# Patient Record
Sex: Female | Born: 1950 | Race: White | Hispanic: No | Smoking: Current every day smoker
Health system: Southern US, Community
[De-identification: ages and names within clinical notes are randomized; demographics above are authoritative.]

## PROBLEM LIST (undated history)

## (undated) DIAGNOSIS — Z720 Tobacco use: Secondary | ICD-10-CM

## (undated) DIAGNOSIS — J449 Chronic obstructive pulmonary disease, unspecified: Secondary | ICD-10-CM

## (undated) DIAGNOSIS — E785 Hyperlipidemia, unspecified: Secondary | ICD-10-CM

## (undated) DIAGNOSIS — I10 Essential (primary) hypertension: Secondary | ICD-10-CM

## (undated) HISTORY — PX: CHOLECYSTECTOMY: SHX55

## (undated) HISTORY — DX: Hyperlipidemia, unspecified: E78.5

## (undated) HISTORY — DX: Tobacco use: Z72.0

## (undated) HISTORY — PX: RECTAL SURGERY: SHX760

## (undated) HISTORY — PX: TONSILLECTOMY: SUR1361

---

## 2009-08-12 ENCOUNTER — Ambulatory Visit: Payer: Self-pay | Admitting: Gastroenterology

## 2009-08-12 ENCOUNTER — Inpatient Hospital Stay (HOSPITAL_COMMUNITY): Admission: AD | Admit: 2009-08-12 | Discharge: 2009-08-15 | Payer: Self-pay | Admitting: Gastroenterology

## 2009-09-09 ENCOUNTER — Ambulatory Visit (HOSPITAL_COMMUNITY): Admission: RE | Admit: 2009-09-09 | Discharge: 2009-09-09 | Payer: Self-pay | Admitting: Gastroenterology

## 2009-10-26 ENCOUNTER — Ambulatory Visit (HOSPITAL_COMMUNITY): Admission: RE | Admit: 2009-10-26 | Discharge: 2009-10-26 | Payer: Self-pay | Admitting: General Surgery

## 2009-10-26 ENCOUNTER — Encounter (INDEPENDENT_AMBULATORY_CARE_PROVIDER_SITE_OTHER): Payer: Self-pay | Admitting: General Surgery

## 2011-01-15 ENCOUNTER — Encounter: Payer: Self-pay | Admitting: Emergency Medicine

## 2011-03-28 LAB — PROTIME-INR
INR: 0.88 (ref 0.00–1.49)
Prothrombin Time: 11.9 seconds (ref 11.6–15.2)

## 2011-03-28 LAB — CBC
Hemoglobin: 14.2 g/dL (ref 12.0–15.0)
MCHC: 33.5 g/dL (ref 30.0–36.0)
MCV: 91.8 fL (ref 78.0–100.0)
RBC: 4.61 MIL/uL (ref 3.87–5.11)
WBC: 12.3 10*3/uL — ABNORMAL HIGH (ref 4.0–10.5)

## 2011-03-28 LAB — BASIC METABOLIC PANEL
CO2: 27 mEq/L (ref 19–32)
Chloride: 107 mEq/L (ref 96–112)
GFR calc Af Amer: >60 mL/min (ref >60)
Sodium: 140 mEq/L (ref 135–145)

## 2011-03-31 LAB — CBC
HCT: 28.9 % — ABNORMAL LOW (ref 36.0–46.0)
HCT: 35.5 % — ABNORMAL LOW (ref 36.0–46.0)
HCT: 42.1 % (ref 36.0–46.0)
Hemoglobin: 12 g/dL (ref 12.0–15.0)
Hemoglobin: 9.9 g/dL — ABNORMAL LOW (ref 12.0–15.0)
MCHC: 33.8 g/dL (ref 30.0–36.0)
MCHC: 33.9 g/dL (ref 30.0–36.0)
MCHC: 34.1 g/dL (ref 30.0–36.0)
MCV: 92.6 fL (ref 78.0–100.0)
MCV: 92.6 fL (ref 78.0–100.0)
Platelets: 239 10*3/uL (ref 150–400)
Platelets: 295 10*3/uL (ref 150–400)
RBC: 3.14 MIL/uL — ABNORMAL LOW (ref 3.87–5.11)
RBC: 3.54 MIL/uL — ABNORMAL LOW (ref 3.87–5.11)
RBC: 3.6 MIL/uL — ABNORMAL LOW (ref 3.87–5.11)
RBC: 3.83 MIL/uL — ABNORMAL LOW (ref 3.87–5.11)
RDW: 13.8 % (ref 11.5–15.5)
RDW: 13.9 % (ref 11.5–15.5)
RDW: 13.9 % (ref 11.5–15.5)
RDW: 14 % (ref 11.5–15.5)
WBC: 10 10*3/uL (ref 4.0–10.5)
WBC: 16.8 10*3/uL — ABNORMAL HIGH (ref 4.0–10.5)
WBC: 9.1 10*3/uL (ref 4.0–10.5)

## 2011-03-31 LAB — CROSSMATCH
ABO/RH(D): O POS
Antibody Screen: NEGATIVE

## 2011-03-31 LAB — COMPREHENSIVE METABOLIC PANEL
ALT: 106 U/L — ABNORMAL HIGH (ref 0–35)
ALT: 92 U/L — ABNORMAL HIGH (ref 0–35)
AST: 97 U/L — ABNORMAL HIGH (ref 0–37)
Albumin: 3.5 g/dL (ref 3.5–5.2)
Alkaline Phosphatase: 305 U/L — ABNORMAL HIGH (ref 39–117)
Alkaline Phosphatase: 374 U/L — ABNORMAL HIGH (ref 39–117)
CO2: 26 mEq/L (ref 19–32)
Chloride: 103 mEq/L (ref 96–112)
Chloride: 108 mEq/L (ref 96–112)
GFR calc Af Amer: >60 mL/min (ref >60)
GFR calc non Af Amer: >60 mL/min (ref >60)
Glucose, Bld: 107 mg/dL — ABNORMAL HIGH (ref 70–99)
Potassium: 3.5 mEq/L (ref 3.5–5.1)
Potassium: 3.7 mEq/L (ref 3.5–5.1)
Sodium: 136 mEq/L (ref 135–145)
Sodium: 140 mEq/L (ref 135–145)
Total Bilirubin: 1.3 mg/dL — ABNORMAL HIGH (ref 0.3–1.2)
Total Protein: 6.7 g/dL (ref 6.0–8.3)

## 2011-03-31 LAB — HEMOGLOBIN AND HEMATOCRIT, BLOOD
HCT: 29.3 % — ABNORMAL LOW (ref 36.0–46.0)
Hemoglobin: 9.9 g/dL — ABNORMAL LOW (ref 12.0–15.0)

## 2011-03-31 LAB — BASIC METABOLIC PANEL
BUN: 8 mg/dL (ref 6–23)
CO2: 25 mEq/L (ref 19–32)
Calcium: 8.2 mg/dL — ABNORMAL LOW (ref 8.4–10.5)
Creatinine, Ser: 0.59 mg/dL (ref 0.4–1.2)
Creatinine, Ser: 0.61 mg/dL (ref 0.4–1.2)
GFR calc Af Amer: >60 mL/min (ref >60)
GFR calc Af Amer: >60 mL/min (ref >60)
GFR calc non Af Amer: >60 mL/min (ref >60)
GFR calc non Af Amer: >60 mL/min (ref >60)
Glucose, Bld: 94 mg/dL (ref 70–99)

## 2011-03-31 LAB — PROTIME-INR: INR: 0.9 (ref 0.00–1.49)

## 2011-03-31 LAB — ABO/RH: ABO/RH(D): O POS

## 2011-05-08 NOTE — Discharge Summary (Signed)
[name], [name] Mccullough               ACCOUNT NO.:  [id number]   MEDICAL RECORD NO.:  [id number]          PATIENT TYPE:  INP   LOCATION:  1508                         FACILITY:  [facility]   PHYSICIAN:  Jordan Hawks. Elnoria Howard, MD    DATE OF BIRTH:  1951-11-13   DATE OF ADMISSION:  08/12/2009  DATE OF DISCHARGE:                               DISCHARGE SUMMARY   DISCHARGE DIAGNOSES:  1. Post sphincterotomy, post endoscopic retrograde      cholangiopancreatography sphincterotomy bleed.  2. Status post embolization of the gastroduodenal artery.  3. Cholelithiasis.  4. Choledocholithiasis, status post stent placement.  5. Pyloric channel ulcer with stenosis.   HISTORY AND PHYSICAL:  Please see the original H and P for full details,  as well as the procedure note.   HOSPITAL COURSE:  The patient after the endoscopic procedures, it was  felt that her bleeding was stable.  All the residual blood was  aspirated, however, approximately 1 hour after the termination of the  endoscopic procedures, nursing reported that she had vomited 100 mL of  fresh blood.  At that point, it was felt that she was having persistent  bleeding from the sphincterotomy site.  Subsequently, an interventional  radiology consultation was obtained from Dr. Gwenith Daily.  With his  interventional procedure, the gastroduodenal artery was embolized.  There was no significant bleed that was identified during radiologic  procedure, however, it was felt that it would be in the patient's best  interest to proceed with embolization.  Since that time, the patient has  done well.  No complaints of any pain in her abdomen from the  embolization and there was no evidence of any further bleeding.  Her  hemoglobin did drop down from 14.3 down to approximately 9.8, and has  remained stable in the upper 9 range.  On the day of discharge, the  patient felt well and she was advanced to a full diet 1-day before her  discharge.  She did report having some  mild intermittent epigastric pain  with p.o. intake, which was similar to her prior gallstone pain.  In  light of that with a normal white blood cell counts and overall stable  clinical picture, it was felt that she could be discharged with close  monitoring on an outpatient basis.  Her alkaline phosphatase has  decreased to 305, AST is 72 from 97 and ALT did increase to 106 from 92.  However, the total bilirubin dropped down from 1.3-0.9.  There is the  possibility that she may have some clots around this sphincterotomy site  that could partially block the drainage of bile.  However, this clot  could resolve and allow for further drainage at this time.  She does  have a 7-French 5-cm stent in place to help temporize the situation.  Plan at this time is for the patient follow up with Dr. Elnoria Howard in 2 weeks.  She was started on sucralfate in addition to her Prilosec OTC.  Additionally, Dr. Elnoria Howard will contact the patient in 2 days to assess and  ensure that she is still clinically stable.  If her pain that is  markedly increased or becomes persistent and/or there is any evidence of  fever, an ERCP will be pursued this week for stone extraction.      Jordan Hawks Elnoria Howard, MD  Electronically Signed     PDH/MEDQ  D:  08/15/2009  T:  08/15/2009  Job:  [id number]

## 2011-05-08 NOTE — H&P (Signed)
[name], [name]               ACCOUNT NO.:  [id number]   MEDICAL RECORD NO.:  [id number]          PATIENT TYPE:  INP   LOCATION:  1224                         FACILITY:  [facility]   PHYSICIAN:  Jordan Hawks. Elnoria Howard, MD    DATE OF BIRTH:  September 23, 1951   DATE OF ADMISSION:  08/12/2009  DATE OF DISCHARGE:                              HISTORY & PHYSICAL   PRIMARY CARE Brandice Busser:  [facility].   HISTORY OF PRESENT ILLNESS:  [name] Mccullough is a patient that was initially  referred for complaints of fever and abdominal pain.  She initially  underwent examination at [facility] and laboratory values  revealed that she had an obstructive pattern in regards to her liver  panel.  CT scan was performed and there was evidence of a filling defect  in the CBD, but could not be determined the exact etiology.  Subsequently she was referred to Dr. Elnoria Howard for further evaluation and  treatment.  She was taken into endoscopy for further workup of these  abnormalities.  A repeat liver panel several days before the endoscopic  procedures revealed that she did have a drop in her transaminases.  It  is suggesting that the obstruction had temporarily relieved itself.  On  the day of endoscopy, the patient initially underwent an endoscopic  ultrasound.  The surveillance of the abdominal structures through the  gastric lumen was completed, however, the echo endoscope was not able to  be advanced to the pylorus because of a pyloric stenosis and an adjacent  pyloric channel ulcer.  The ulcer required balloon dilation and repeat  attempts to pass the echo endoscope failed.  The area was then dilated  again and then at that point, it was felt that an ERCP should be  pursued.  During the ERCP procedure, cannulation of the bile duct was  performed with mild-moderate difficulty.  Contrast injection did reveal  a stone in the proximal CBD measuring approximately 8 mm in size.  The  patient was found to  have a very large and generous papilla.  A large  sphincterotomy was created.  However, an arterial was cut during the  creation of a sphincterotomy.  This resulted in significant amount of  bleeding.  Because of the bleeding, the stone was not able to be  extracted.  Attention had to be focused on hemostasis.  Subsequently it  was felt that a biliary stent would help to temporize any further issues  in regards to her common bile duct stone.  Multiple injections of  epinephrine and also repeat procedures with duodenoscope and also the  upper endoscope initially arrest of the bleeding.  Also with the help of  Dr. Arlyce Dice, a total of what was felt to be four successfully placed  hemoclips.  During the time of the procedures, her vital signs remained  stable.  In fact, she was hypertensive.  She was hypertensive from the  initiation of the procedures.  At that point with monitoring the site,  there was no evidence of any further bleeding.  The procedure was  terminated.  Subsequently in the recovery area approximately 1 hour  after the procedure, Dr. Elnoria Howard was notified that she had vomited up 100  mL of fresh blood.  Because of this type of finding, it was felt that  her bleeding had recurred and subsequently she was referred to  Interventional Radiology where Dr. Bonnielee Haff was to attempt to arrest the  bleeding.   PAST MEDICAL/SURGICAL HISTORY:  Significant for hypertension.   MEDICATIONS:  Lisinopril.   ALLERGIES:  NO KNOWN DRUG ALLERGIES.   REVIEW OF SYSTEMS:  As stated above in the history present illness,  otherwise negative.   FAMILY HISTORY:  Noncontributory.   SOCIAL HISTORY:  Negative for alcohol, tobacco, illicit drug use.   PHYSICAL EXAMINATION:  VITAL SIGNS:  Blood pressure is in the 170s/110s,  heart rate ranges from 88-102, pulse ox is 100% on 2 liters nasal  cannula.  GENERAL:  The patient is sedated from the procedure.  HEENT:  Normocephalic, atraumatic.  Extraocular  muscles appear to be  intact.  NECK:  Supple.  No lymphadenopathy.  LUNGS:  Clear to auscultation bilaterally.  CARDIOVASCULAR:  Regular rate and rhythm.  ABDOMEN:  Flat, soft, nontender, nondistended.  Positive bowel sounds.  EXTREMITIES:  No clubbing, cyanosis or edema.   LABORATORY VALUES:  STAT hemoglobin revealed it was at 14.3.   IMPRESSION:  1. Post-sphincterotomy bleeding.  2. Common bile duct stone.  3. Pyloric channel ulcer.  4. Pyloric stenosis.   In light of no further bleeding, it is felt that Dr. Bonnielee Haff stopped her  bleeding during his radiologic procedure.  If this fails, then a  surgical consultation will be warranted.  However, it is possible to  reattempt hemostasis endoscopically if that is required.  Her hemoglobin  will be checked on a q.4 h. basis for the first 12 hours.  She will be  typed and screened for further management pending her clinical course.      Jordan Hawks Elnoria Howard, MD  Electronically Signed     PDH/MEDQ  D:  08/13/2009  T:  08/13/2009  Job:  [id number]   cc:   [facility]

## 2018-11-07 ENCOUNTER — Encounter (HOSPITAL_COMMUNITY): Payer: Self-pay | Admitting: *Deleted

## 2018-11-07 ENCOUNTER — Other Ambulatory Visit: Payer: Self-pay

## 2018-11-07 ENCOUNTER — Emergency Department (HOSPITAL_COMMUNITY): Payer: Medicare Other

## 2018-11-07 ENCOUNTER — Emergency Department (HOSPITAL_COMMUNITY)
Admission: EM | Admit: 2018-11-07 | Discharge: 2018-11-07 | Disposition: A | Discharge status: Home / Self Care | Payer: Medicare Other | Attending: Emergency Medicine | Admitting: Emergency Medicine

## 2018-11-07 DIAGNOSIS — F17200 Nicotine dependence, unspecified, uncomplicated: Secondary | ICD-10-CM | POA: Insufficient documentation

## 2018-11-07 DIAGNOSIS — R0602 Shortness of breath: Secondary | ICD-10-CM | POA: Diagnosis present

## 2018-11-07 DIAGNOSIS — I10 Essential (primary) hypertension: Secondary | ICD-10-CM | POA: Insufficient documentation

## 2018-11-07 DIAGNOSIS — J441 Chronic obstructive pulmonary disease with (acute) exacerbation: Secondary | ICD-10-CM | POA: Diagnosis not present

## 2018-11-07 DIAGNOSIS — Z79899 Other long term (current) drug therapy: Secondary | ICD-10-CM | POA: Diagnosis not present

## 2018-11-07 HISTORY — DX: Essential (primary) hypertension: I10

## 2018-11-07 LAB — BRAIN NATRIURETIC PEPTIDE: B NATRIURETIC PEPTIDE 5: 57.6 pg/mL (ref 0.0–100.0)

## 2018-11-07 LAB — BASIC METABOLIC PANEL
Anion gap: 11 (ref 5–15)
BUN: 13 mg/dL (ref 8–23)
CO2: 25 mmol/L (ref 22–32)
CREATININE: 0.8 mg/dL (ref 0.44–1.00)
Calcium: 9.6 mg/dL (ref 8.9–10.3)
Chloride: 106 mmol/L (ref 98–111)
GFR calc non Af Amer: >60 mL/min (ref >60)
Glucose, Bld: 106 mg/dL — ABNORMAL HIGH (ref 70–99)
POTASSIUM: 3.8 mmol/L (ref 3.5–5.1)
SODIUM: 142 mmol/L (ref 135–145)

## 2018-11-07 LAB — CBC WITH DIFFERENTIAL/PLATELET
ABS IMMATURE GRANULOCYTES: 0.03 10*3/uL (ref 0.00–0.07)
BASOS ABS: 0 10*3/uL (ref 0.0–0.1)
BASOS PCT: 0 %
Eosinophils Absolute: 0.1 10*3/uL (ref 0.0–0.5)
Eosinophils Relative: 1 %
HCT: 48.2 % — ABNORMAL HIGH (ref 36.0–46.0)
Hemoglobin: 14.8 g/dL (ref 12.0–15.0)
Immature Granulocytes: 0 %
Lymphocytes Relative: 10 %
Lymphs Abs: 1.2 10*3/uL (ref 0.7–4.0)
MCH: 30.6 pg (ref 26.0–34.0)
MCHC: 30.7 g/dL (ref 30.0–36.0)
MCV: 99.6 fL (ref 80.0–100.0)
MONO ABS: 1 10*3/uL (ref 0.1–1.0)
Monocytes Relative: 8 %
NEUTROS ABS: 9.4 10*3/uL — AB (ref 1.7–7.7)
NEUTROS PCT: 81 %
NRBC: 0 % (ref 0.0–0.2)
Platelets: 233 10*3/uL (ref 150–400)
RBC: 4.84 MIL/uL (ref 3.87–5.11)
RDW: 12.6 % (ref 11.5–15.5)
WBC: 11.7 10*3/uL — AB (ref 4.0–10.5)

## 2018-11-07 LAB — I-STAT TROPONIN, ED: TROPONIN I, POC: 0 ng/mL (ref 0.00–0.08)

## 2018-11-07 MED ORDER — ALBUTEROL SULFATE HFA 108 (90 BASE) MCG/ACT IN AERS
2.0000 | INHALATION_SPRAY | Freq: Once | RESPIRATORY_TRACT | Status: AC
  Administered 2018-11-07: 2 via RESPIRATORY_TRACT
  Filled 2018-11-07: qty 6.7

## 2018-11-07 MED ORDER — IPRATROPIUM BROMIDE 0.02 % IN SOLN
0.5000 mg | Freq: Once | RESPIRATORY_TRACT | Status: AC
  Administered 2018-11-07: 0.5 mg via RESPIRATORY_TRACT
  Filled 2018-11-07: qty 2.5

## 2018-11-07 MED ORDER — PREDNISONE 10 MG PO TABS: 40.0000 mg | ORAL_TABLET | Freq: Every day | ORAL | 0 refills | Status: AC

## 2018-11-07 MED ORDER — ALBUTEROL (5 MG/ML) CONTINUOUS INHALATION SOLN
10.0000 mg/h | INHALATION_SOLUTION | RESPIRATORY_TRACT | Status: DC
  Administered 2018-11-07: 10 mg/h via RESPIRATORY_TRACT
  Filled 2018-11-07: qty 20

## 2018-11-07 MED ORDER — METHYLPREDNISOLONE SODIUM SUCC 125 MG IJ SOLR
125.0000 mg | Freq: Once | INTRAMUSCULAR | Status: AC
  Administered 2018-11-07: 125 mg via INTRAVENOUS
  Filled 2018-11-07: qty 2

## 2018-11-07 NOTE — ED Notes (Signed)
Patient transported to X-ray 

## 2018-11-07 NOTE — ED Triage Notes (Signed)
Pt started having shortness of breath last night with productive cough. Pt noted to have audible wheezing. Denies CP, reports feeling like she can "get a good breath in, but I can't get a good breath out"

## 2018-11-07 NOTE — ED Provider Notes (Addendum)
[facility] EMERGENCY DEPARTMENT Provider Note  CSN: [id number] Arrival date & time: 11/07/18 0546  Chief Complaint(s) Shortness of Breath  HPI [name] Mccullough is a 67 y.o. female   The history is provided by the patient.  Shortness of Breath  This is a new problem. The average episode lasts 2 days. The problem occurs continuously.The problem has been gradually worsening. Associated symptoms include cough, sputum production and wheezing. Pertinent negatives include no fever, no rhinorrhea, no chest pain and no leg swelling. Risk factors include smoking. Associated medical issues include COPD (undiagnosed; chronic smoker 1.5 pk/day for 26yr). Associated medical issues do not include PE, CAD, heart failure, past MI or DVT.    Past Medical History Past Medical History:  Diagnosis Date  . Hypertension    There are no active problems to display for this patient.  Home Medication(s) Prior to Admission medications   Medication Sig Start Date End Date Taking? Authorizing Provider  atorvastatin (LIPITOR) 10 MG tablet Take 10 mg by mouth at bedtime. 10/28/18  Yes [provider]  escitalopram (LEXAPRO) 10 MG tablet Take 10 mg by mouth daily. 10/28/18  Yes [provider]  lisinopril (PRINIVIL,ZESTRIL) 20 MG tablet Take 20 mg by mouth daily. 10/27/18  Yes [provider]                                                                                                                                    Past Surgical History History reviewed. No pertinent surgical history. Family History No family history on file.  Social History Social History   Tobacco Use  . Smoking status: Current Every Day Smoker  . Smokeless tobacco: Never Used  Substance Use Topics  . Alcohol use: Never    Frequency: Never  . Drug use: Never   Allergies Patient has no known allergies.  Review of Systems Review of Systems  Constitutional: Negative for fever.  HENT:  Negative for rhinorrhea.   Respiratory: Positive for cough, sputum production, shortness of breath and wheezing.   Cardiovascular: Negative for chest pain and leg swelling.   All other systems are reviewed and are negative for acute change except as noted in the HPI  Physical Exam Vital Signs  I have reviewed the triage vital signs BP (!) 173/86   Pulse (!) 101   Temp 98 F (36.7 C) (Oral)   Resp 20   SpO2 94%   Physical Exam  Constitutional: She is oriented to person, place, and time. She appears well-developed and well-nourished. No distress.  HENT:  Head: Normocephalic and atraumatic.  Nose: Nose normal.  Eyes: Pupils are equal, round, and reactive to light. Conjunctivae and EOM are normal. Right eye exhibits no discharge. Left eye exhibits no discharge. No scleral icterus.  Neck: Normal range of motion. Neck supple.  Cardiovascular: Normal rate and regular rhythm. Exam reveals no gallop and no friction rub.  No murmur  heard. Pulmonary/Chest: Accessory muscle usage present. No stridor. No respiratory distress. She has no rales.  Poor air movement throughout  Abdominal: Soft. She exhibits no distension. There is no tenderness.  Musculoskeletal: She exhibits no edema or tenderness.  Neurological: She is alert and oriented to person, place, and time.  Skin: Skin is warm and dry. No rash noted. She is not diaphoretic. No erythema.  Psychiatric: She has a normal mood and affect.  Vitals reviewed.   ED Results and Treatments Labs (all labs ordered are listed, but only abnormal results are displayed) Labs Reviewed  CBC WITH DIFFERENTIAL/PLATELET - Abnormal; Notable for the following components:      Result Value   WBC 11.7 (*)    HCT 48.2 (*)    Neutro Abs 9.4 (*)    All other components within normal limits  BASIC METABOLIC PANEL - Abnormal; Notable for the following components:   Glucose, Bld 106 (*)    All other components within normal limits  BRAIN NATRIURETIC PEPTIDE   I-STAT TROPONIN, ED                                                                                                                         EKG  EKG Interpretation  Date/Time:  Friday November 07 2018 05:56:43 EST Ventricular Rate:  95 PR Interval:    QRS Duration: 116 QT Interval:  395 QTC Calculation: 497 R Axis:   72 Text Interpretation:  Sinus rhythm Biatrial enlargement Left ventricular hypertrophy Anterior infarct, old Nonspecific T abnormalities, lateral leads Baseline wander in lead(s) II NO STEMI No old tracing to compare Confirmed by Drema Pryardama,  [phone]([id number]) on 11/07/2018 6:10:10 AM      Radiology Dg Chest 2 View  Result Date: 11/07/2018 CLINICAL DATA:  Acute onset of shortness of breath and productive cough. Wheezing. EXAM: CHEST - 2 VIEW COMPARISON:  None. FINDINGS: The lungs are hyperexpanded, with flattening of the hemidiaphragms, compatible with COPD. There is no evidence of focal opacification, pleural effusion or pneumothorax. The heart is normal in size; the mediastinal contour is within normal limits. No acute osseous abnormalities are seen. IMPRESSION: Findings of COPD.  Lungs otherwise grossly clear. Electronically Signed   By: Roanna RaiderJeffery  Chang M.D.   On: 11/07/2018 06:45   Pertinent labs & imaging results that were available during my care of the patient were reviewed by me and considered in my medical decision making (see chart for details).  Medications Ordered in ED Medications  albuterol (PROVENTIL,VENTOLIN) solution continuous neb (has no administration in time range)  ipratropium (ATROVENT) nebulizer solution 0.5 mg (has no administration in time range)  methylPREDNISolone sodium succinate (SOLU-MEDROL) 125 mg/2 mL injection 125 mg (125 mg Intravenous Given 11/07/18 0652)  Procedures Procedures   Counseled patient for  approximately 5 minutes regarding smoking cessation. Discussed risks of smoking and how they applied and affected their visit here today. Patient not ready to quit at this time, however will follow up with their primary doctor when they are. Provided with cessation resources  CPT code: 40981: intermediate counseling for smoking cessation   (including critical care time)  Medical Decision Making / ED Course I have reviewed the nursing notes for this encounter and the patient's prior records (if available in EHR or on provided paperwork).    Shortness of breath and long-term smoker.  Poor air movement throughout with accessory muscle use.  Satting well on room air.  Likely undiagnosed COPD with exacerbation.  Chest x-ray without evidence of pneumonia, and confirming findings of COPD.  Patient will be given breathing treatments and Solu-Medrol.  Screening labs reassuring with mild leukocytosis, no anemia.  No significant electrolyte derangements or renal insufficiency.  Patient care turned over to Dr Madilyn Hook at 0730. Patient case and results discussed in detail; please see their note for further ED managment.      Final Clinical Impression(s) / ED Diagnoses Final diagnoses:  SOB (shortness of breath)  COPD exacerbation (HCC)      This chart was dictated using voice recognition software.  Despite best efforts to proofread,  errors can occur which can change the documentation meaning.     Nira Conn, MD 11/07/18 [phone]

## 2018-11-07 NOTE — ED Provider Notes (Signed)
Pt care assumed at 0700. Pt w/ hx/o tobacco use here for eval of sob. Recent patient is consistent with COPD exacerbation. No prior history of COPD. On assessment following hour-long nebulizer treatments she is feeling significantly improved. She does have persistent faint and expiratory wheezes. No respiratory distress on examination. Presentation is not consistent with PE, pneumonia, CHF. She is able to ambulate the department without difficulty. She did have a brief drop in her sats with ambulation to 89% but was asymptomatic at the time and felt like she was breathing at her baseline on ambulation. Plan to discharge home with treatment for COPD exacerbation. Counseled on smoking cessation as well as pulmonary PCP follow-up. Return precautions discussed.   Tilden Fossaees, Anthany Thornhill, MD 11/07/18 [phone]

## 2018-11-13 ENCOUNTER — Ambulatory Visit (INDEPENDENT_AMBULATORY_CARE_PROVIDER_SITE_OTHER): Payer: Medicare Other | Admitting: Pulmonary Disease

## 2018-11-13 ENCOUNTER — Encounter: Payer: Self-pay | Admitting: Pulmonary Disease

## 2018-11-13 VITALS — BP 118/72 | HR 69 | Ht 64.0 in | Wt 114.8 lb

## 2018-11-13 DIAGNOSIS — R0989 Other specified symptoms and signs involving the circulatory and respiratory systems: Secondary | ICD-10-CM

## 2018-11-13 DIAGNOSIS — R9389 Abnormal findings on diagnostic imaging of other specified body structures: Secondary | ICD-10-CM

## 2018-11-13 DIAGNOSIS — J986 Disorders of diaphragm: Secondary | ICD-10-CM | POA: Diagnosis not present

## 2018-11-13 DIAGNOSIS — Z720 Tobacco use: Secondary | ICD-10-CM

## 2018-11-13 DIAGNOSIS — R0602 Shortness of breath: Secondary | ICD-10-CM

## 2018-11-13 MED ORDER — UMECLIDINIUM-VILANTEROL 62.5-25 MCG/INH IN AEPB: 1.0000 | INHALATION_SPRAY | Freq: Every day | RESPIRATORY_TRACT | 2 refills | Status: DC

## 2018-11-13 MED ORDER — VITAMIN D (CHOLECALCIFEROL) 50 MCG (2000 UT) PO CAPS: 2000.0000 [IU] | ORAL_CAPSULE | Freq: Every day | ORAL | 0 refills | Status: DC

## 2018-11-13 MED ORDER — UMECLIDINIUM-VILANTEROL 62.5-25 MCG/INH IN AEPB: 1.0000 | INHALATION_SPRAY | Freq: Every day | RESPIRATORY_TRACT | 0 refills | Status: DC

## 2018-11-13 NOTE — Progress Notes (Signed)
Synopsis: Referred in November 2019 for COPD by Tilden Fossaees, Elizabeth, MD  Subjective:   PATIENT ID: [name] JunkerBonita S Tracz GENDER: female DOB: [birth year], MRN: [id number]  Chief Complaint  Patient presents with  . Consult    States she recently went to ER for an exacerbation. States she has a upper gastric ulcer and when aggravated she gets chest pain. Was given proventil rescue inhaler that she has not used.     She was recently seen in the ED. There was concern for COPD exacerbation. Never had PFTS before. She was given albuterol and steroids for a couple days and she finally started to feel better. This was her first time this has every happened. She has never had a COPD exacerbation like this before. She has been smoking since age 67. Now smoking 3-4 cigarettes per day. She feels SOB while walking and DOE while climbing stairs.  She has daily cough with sputum production.  She occasionally has wheezing.  She has noticed significant decline over the past couple of months.  This last/first exacerbation requiring ED admission really made her nervous about her breathing and she does understand that she needs to quit smoking.  She is reluctant to try medications to help quit such as Chantix.  Due to the side effects.  She has nicotine patches at home and will plan to use those over the coming weeks and to help decrease her cigarette smoking.  He does have a plan to quit.  Past Medical History:  Diagnosis Date  . HLD (hyperlipidemia)   . Hypertension   . Tobacco abuse      Family History  Problem Relation Age of Onset  . Heart disease Mother   . Cancer Father        unknown  kind      Past Surgical History:  Procedure Laterality Date  . CHOLECYSTECTOMY      Social History   Socioeconomic History  . Marital status: Divorced    Spouse name: Not on file  . Number of children: Not on file  . Years of education: Not on file  . Highest education level: Not on file  Occupational History  . Not  on file  Social Needs  . Financial resource strain: Not on file  . Food insecurity:    Worry: Not on file    Inability: Not on file  . Transportation needs:    Medical: Not on file    Non-medical: Not on file  Tobacco Use  . Smoking status: Current Every Day Smoker    Types: Cigarettes  . Smokeless tobacco: Never Used  . Tobacco comment: pack per day 11.21.19  Substance and Sexual Activity  . Alcohol use: Yes    Frequency: Never    Comment: occasional, social   . Drug use: Never  . Sexual activity: Not on file  Lifestyle  . Physical activity:    Days per week: Not on file    Minutes per session: Not on file  . Stress: Not on file  Relationships  . Social connections:    Talks on phone: Not on file    Gets together: Not on file    Attends religious service: Not on file    Active member of club or organization: Not on file    Attends meetings of clubs or organizations: Not on file    Relationship status: Not on file  . Intimate partner violence:    Fear of current or ex partner: Not on file  Emotionally abused: Not on file    Physically abused: Not on file    Forced sexual activity: Not on file  Other Topics Concern  . Not on file  Social History Narrative  . Not on file     No Known Allergies   Outpatient Medications Prior to Visit  Medication Sig Dispense Refill  . atorvastatin (LIPITOR) 10 MG tablet Take 10 mg by mouth at bedtime.  11  . escitalopram (LEXAPRO) 10 MG tablet Take 10 mg by mouth daily.  11  . lisinopril (PRINIVIL,ZESTRIL) 20 MG tablet Take 20 mg by mouth daily.  11   No facility-administered medications prior to visit.     Review of Systems  Constitutional: Negative.   HENT: Positive for congestion. Negative for ear discharge, ear pain, hearing loss, nosebleeds, sinus pain, sore throat and tinnitus.   Eyes: Negative.   Respiratory: Positive for cough, sputum production, shortness of breath and wheezing. Negative for hemoptysis and stridor.     Cardiovascular: Positive for chest pain.  Gastrointestinal: Positive for heartburn. Negative for abdominal pain, blood in stool, constipation, diarrhea, melena, nausea and vomiting.  Genitourinary: Negative.   Musculoskeletal: Negative.   Skin: Negative.   Neurological: Negative.   Endo/Heme/Allergies: Negative.   Psychiatric/Behavioral: Negative.      Objective:  Physical Exam  Constitutional: She is oriented to person, place, and time. She appears well-developed. No distress.  HENT:  Head: Normocephalic and atraumatic.  Mouth/Throat: Oropharynx is clear and moist. No oropharyngeal exudate.  Eyes: Pupils are equal, round, and reactive to light. Conjunctivae and EOM are normal.  Neck: No JVD present. No tracheal deviation present.  Loss of supraclavicular fat  Cardiovascular: Normal rate, regular rhythm, S1 normal, S2 normal and intact distal pulses.  Distant heart tones  Pulmonary/Chest: No accessory muscle usage or stridor. No tachypnea. She has decreased breath sounds (throughout all lung fields). She has no wheezes. She has no rhonchi. She has no rales.  Increased AP chest diameter  Abdominal: Soft. Bowel sounds are normal. She exhibits no distension. There is no tenderness.  Musculoskeletal: She exhibits no edema or deformity (muscle wasting ).  Neurological: She is alert and oriented to person, place, and time.  Skin: Skin is warm and dry. Capillary refill takes less than 2 seconds. No rash noted.  Psychiatric: She has a normal mood and affect. Her behavior is normal.  Vitals reviewed.    Vitals:   11/13/18 1024  BP: 118/72  Pulse: 69  SpO2: 99%  Weight: 114 lb 12.8 oz (52.1 kg)  Height: 5\' 4"  (1.626 m)   99% on RA BMI Readings from Last 3 Encounters:  11/13/18 19.71 kg/m   Wt Readings from Last 3 Encounters:  11/13/18 114 lb 12.8 oz (52.1 kg)     CBC    Component Value Date/Time   WBC 11.7 (H) 11/07/2018 0627   RBC 4.84 11/07/2018 0627   HGB 14.8  11/07/2018 0627   HCT 48.2 (H) 11/07/2018 0627   PLT 233 11/07/2018 0627   MCV 99.6 11/07/2018 0627   MCH 30.6 11/07/2018 0627   MCHC 30.7 11/07/2018 0627   RDW 12.6 11/07/2018 0627   LYMPHSABS 1.2 11/07/2018 0627   MONOABS 1.0 11/07/2018 0627   EOSABS 0.1 11/07/2018 0627   BASOSABS 0.0 11/07/2018 0627   Chest Imaging: 11/07/2018 chest x-ray: Flattening of the bilateral diaphragms, slender cardiac silhouette, hyperinflation, significant bone loss in the vertebral bodies The patient's images have been independently reviewed by me.   Pulmonary  Functions Testing Results: No flowsheet data found.  FeNO: None   Pathology: None   Echocardiogram: None   Heart Catheterization: None     Assessment & Plan:   SOB (shortness of breath) - Plan: Pulmonary Function Test, 6 minute walk  Hyperinflation of lungs  Bilateral flattening of diaphragm  Tobacco abuse  Discussion:  This is a 67 year old female with a significant history of smoking as well as chest imaging concerning for COPD.  She has not had any prior PFTs.  I do suspect clinically that she has COPD based on her chest x-ray and symptoms.   We will recommend the following: We will start patient on Anoro Ellipta Vitamin D supplement 2000 units daily. We will obtain full PFTs along with 6-minute walk test. Will enroll patient in our lung cancer screening program and follow-up with Kandice Robinsons, NP for shared decision-making  Return to clinic in 6 weeks to see if her symptoms of dyspnea on exertion are improved.  Greater than 50% of this patient's 60-minute office visit was spent face-to-face discussing the recommendations for the above treatment plan.  As well as plans for enrollment in our lung cancer screening program and the addition of new inhaler medications.  In conjunction greater than 10 minutes was spent counseling on smoking cessation.   Current Outpatient Medications:  .  atorvastatin (LIPITOR) 10 MG tablet, Take  10 mg by mouth at bedtime., Disp: , Rfl: 11 .  escitalopram (LEXAPRO) 10 MG tablet, Take 10 mg by mouth daily., Disp: , Rfl: 11 .  lisinopril (PRINIVIL,ZESTRIL) 20 MG tablet, Take 20 mg by mouth daily., Disp: , Rfl: 11 .  umeclidinium-vilanterol (ANORO ELLIPTA) 62.5-25 MCG/INH AEPB, Inhale 1 puff into the lungs daily., Disp: 1 each, Rfl: 2 .  umeclidinium-vilanterol (ANORO ELLIPTA) 62.5-25 MCG/INH AEPB, Inhale 1 puff into the lungs daily., Disp: 1 each, Rfl: 0 .  Vitamin D, Cholecalciferol, 50 MCG (2000 UT) CAPS, Take 2,000 Units by mouth daily., Disp: 30 capsule, Rfl: 0   Josephine Igo, DO [facility] 11/13/2018 12:07 PM

## 2018-11-13 NOTE — Patient Instructions (Addendum)
Thank you for visiting Dr. Tonia BroomsIcard at [facility]. Today we recommend the following: Orders Placed This Encounter  Procedures  . Pulmonary Function Test  . 6 minute walk   Meds ordered this encounter  Medications  . umeclidinium-vilanterol (ANORO ELLIPTA) 62.5-25 MCG/INH AEPB    Sig: Inhale 1 puff into the lungs daily.    Dispense:  1 each    Refill:  2  . umeclidinium-vilanterol (ANORO ELLIPTA) 62.5-25 MCG/INH AEPB    Sig: Inhale 1 puff into the lungs daily.    Dispense:  1 each    Refill:  0    Order Specific Question:   Lot Number?    Answer:   [id number]    Order Specific Question:   Expiration Date?    Answer:   02/14/2020    Order Specific Question:   Manufacturer?    Answer:   [employer] [[id number]]    Order Specific Question:   Quantity    Answer:   1   Setup follow up appointment next available with Kandice RobinsonsSarah Groce, NP for our lung cancer screening program and smoking cessation.   Return in about 6 weeks (around 12/25/2018).

## 2018-12-04 ENCOUNTER — Ambulatory Visit (INDEPENDENT_AMBULATORY_CARE_PROVIDER_SITE_OTHER): Payer: Medicare Other | Admitting: Pulmonary Disease

## 2018-12-04 DIAGNOSIS — R0602 Shortness of breath: Secondary | ICD-10-CM | POA: Diagnosis not present

## 2018-12-04 LAB — PULMONARY FUNCTION TEST
DL/VA % PRED: 51 %
DL/VA: 2.48 ml/min/mmHg/L
DLCO cor % pred: 47 %
DLCO cor: 11.58 ml/min/mmHg
DLCO unc % pred: 49 %
DLCO unc: 12.05 ml/min/mmHg
FEF 25-75 Post: 0.81 L/sec
FEF 25-75 Pre: 0.61 L/sec
FEF2575-%CHANGE-POST: 33 %
FEF2575-%PRED-PRE: 30 %
FEF2575-%Pred-Post: 40 %
FEV1-%Change-Post: 18 %
FEV1-%Pred-Post: 63 %
FEV1-%Pred-Pre: 53 %
FEV1-Post: 1.48 L
FEV1-Pre: 1.25 L
FEV1FVC-%Change-Post: 3 %
FEV1FVC-%Pred-Pre: 64 %
FEV6-%Change-Post: 14 %
FEV6-%Pred-Post: 97 %
FEV6-%Pred-Pre: 85 %
FEV6-POST: 2.87 L
FEV6-PRE: 2.51 L
FEV6FVC-%Change-Post: 0 %
FEV6FVC-%Pred-Post: 103 %
FEV6FVC-%Pred-Pre: 103 %
FVC-%CHANGE-POST: 14 %
FVC-%Pred-Post: 94 %
FVC-%Pred-Pre: 82 %
FVC-PRE: 2.53 L
FVC-Post: 2.89 L
POST FEV6/FVC RATIO: 99 %
PRE FEV6/FVC RATIO: 99 %
Post FEV1/FVC ratio: 51 %
Pre FEV1/FVC ratio: 49 %
RV % pred: 162 %
RV: 3.49 L
TLC % pred: 121 %
TLC: 6.13 L

## 2018-12-04 NOTE — Progress Notes (Signed)
PFT completed today.  

## 2019-01-01 ENCOUNTER — Encounter: Payer: Self-pay | Admitting: Pulmonary Disease

## 2019-01-01 ENCOUNTER — Ambulatory Visit (INDEPENDENT_AMBULATORY_CARE_PROVIDER_SITE_OTHER): Payer: Medicare Other | Admitting: *Deleted

## 2019-01-01 ENCOUNTER — Ambulatory Visit (INDEPENDENT_AMBULATORY_CARE_PROVIDER_SITE_OTHER): Payer: Medicare Other | Admitting: Pulmonary Disease

## 2019-01-01 VITALS — BP 128/70 | HR 69 | Ht 64.0 in | Wt 115.2 lb

## 2019-01-01 DIAGNOSIS — J449 Chronic obstructive pulmonary disease, unspecified: Secondary | ICD-10-CM | POA: Diagnosis not present

## 2019-01-01 DIAGNOSIS — R05 Cough: Secondary | ICD-10-CM

## 2019-01-01 DIAGNOSIS — R059 Cough, unspecified: Secondary | ICD-10-CM

## 2019-01-01 DIAGNOSIS — Z720 Tobacco use: Secondary | ICD-10-CM

## 2019-01-01 DIAGNOSIS — R0602 Shortness of breath: Secondary | ICD-10-CM

## 2019-01-01 MED ORDER — UMECLIDINIUM-VILANTEROL 62.5-25 MCG/INH IN AEPB: 1.0000 | INHALATION_SPRAY | Freq: Every day | RESPIRATORY_TRACT | 0 refills | Status: DC

## 2019-01-01 NOTE — Patient Instructions (Addendum)
Thank you for visiting Dr. Tonia Brooms at [facility]. Today we recommend the following:  Meds ordered this encounter  Medications  . umeclidinium-vilanterol (ANORO ELLIPTA) 62.5-25 MCG/INH AEPB    Sig: Inhale 1 puff into the lungs daily.    Dispense:  2 each    Refill:  0   Please schedule a follow up appointment with Kandice Robinsons, NP for enrollment into the [place] Lung Cancer Screening Program.   Return in about 6 months (around 07/02/2019). With me or APP.

## 2019-01-01 NOTE — Progress Notes (Signed)
SIX MIN WALK 01/01/2019 11/13/2018  Medications Lisinopril 20mg  & Lexapro 10mg  taken at 7:30a -  Supplimental Oxygen during Test? (L/min) No No  Laps 7 -  Partial Lap (in Meters) 0 -  Baseline BP (sitting) 122/68 -  Baseline Heartrate 67 -  Baseline Dyspnea (Borg Scale) 0 -  Baseline Fatigue (Borg Scale) 2 -  Baseline SPO2 97 -  BP (sitting) 136/62 -  Heartrate 82 -  Dyspnea (Borg Scale) 0 -  Fatigue (Borg Scale) 0 -  SPO2 96 -  BP (sitting) 132/64 -  Heartrate 78 -  SPO2 98 -  Stopped or Paused before Six Minutes No -  Distance Completed 238 -  Tech Comments: pt completed test at moderate pace with no complaints. steady gait Walked a moderate pace, denies SOB. Tolerated well.

## 2019-01-01 NOTE — Progress Notes (Signed)
Synopsis: Referred in November 2019 for COPD by No ref. provider found  Subjective:   PATIENT ID: [name] JunkerBonita S Rawdon GENDER: female DOB: [birth year], MRN: [id number]  Chief Complaint  Patient presents with  . Follow-up    6MW test today. PFT done. States her breathing has been good. No new concerns. States she finished the sample of Anoro but she cannot afford it.     She was recently seen in the ED. There was concern for COPD exacerbation. Never had PFTS before. She was given albuterol and steroids for a couple days and she finally started to feel better. This was her first time this has every happened. She has never had a COPD exacerbation like this before. She has been smoking since age 89. Now smoking 3-4 cigarettes per day. She feels SOB while walking and DOE while climbing stairs.  She has daily cough with sputum production.  She occasionally has wheezing.  She has noticed significant decline over the past couple of months.  This last/first exacerbation requiring ED admission really made her nervous about her breathing and she does understand that she needs to quit smoking.  She is reluctant to try medications to help quit such as Chantix.  Due to the side effects.  She has nicotine patches at home and will plan to use those over the coming weeks and to help decrease her cigarette smoking.  He does have a plan to quit.  OV 01/01/2019: Patient seen today in the office after follow-up with PFTs.  Was seen originally in the emergency room for COPD exacerbation.  Longtime smoker 50+ years at 1.5 packs/day.  PFTs completed prior to today's office visit which reveals a postbronchodilator FEV1 of 63%, ratio 51, RV/TLC 133%, DLCO 49%.  Consistent with moderate COPD.  Patient has not been using the Anoro.  She looked at the cost online and was unable to afford this.  She has continued to smoke.  She states that she wants to quit but she does not feel like she is going to be able to.  She states that she  "enjoys smoking".  She has daily cough and sputum production.  She does have some mild dyspnea.  Overall she states her breathing is back to "normal" for her baseline.  Past Medical History:  Diagnosis Date  . HLD (hyperlipidemia)   . Hypertension   . Tobacco abuse      Family History  Problem Relation Age of Onset  . Heart disease Mother   . Cancer Father        unknown  kind      Past Surgical History:  Procedure Laterality Date  . CHOLECYSTECTOMY      Social History   Socioeconomic History  . Marital status: Divorced    Spouse name: Not on file  . Number of children: Not on file  . Years of education: Not on file  . Highest education level: Not on file  Occupational History  . Not on file  Social Needs  . Financial resource strain: Not on file  . Food insecurity:    Worry: Not on file    Inability: Not on file  . Transportation needs:    Medical: Not on file    Non-medical: Not on file  Tobacco Use  . Smoking status: Current Every Day Smoker    Types: Cigarettes  . Smokeless tobacco: Never Used  . Tobacco comment: pack per day 11.21.19  Substance and Sexual Activity  . Alcohol use: Yes  Frequency: Never    Comment: occasional, social   . Drug use: Never  . Sexual activity: Not on file  Lifestyle  . Physical activity:    Days per week: Not on file    Minutes per session: Not on file  . Stress: Not on file  Relationships  . Social connections:    Talks on phone: Not on file    Gets together: Not on file    Attends religious service: Not on file    Active member of club or organization: Not on file    Attends meetings of clubs or organizations: Not on file    Relationship status: Not on file  . Intimate partner violence:    Fear of current or ex partner: Not on file    Emotionally abused: Not on file    Physically abused: Not on file    Forced sexual activity: Not on file  Other Topics Concern  . Not on file  Social History Narrative  . Not on  file     No Known Allergies   Outpatient Medications Prior to Visit  Medication Sig Dispense Refill  . atorvastatin (LIPITOR) 10 MG tablet Take 10 mg by mouth at bedtime.  11  . escitalopram (LEXAPRO) 10 MG tablet Take 10 mg by mouth daily.  11  . lisinopril (PRINIVIL,ZESTRIL) 20 MG tablet Take 20 mg by mouth daily.  11  . Vitamin D, Cholecalciferol, 50 MCG (2000 UT) CAPS Take 2,000 Units by mouth daily. 30 capsule 0  . umeclidinium-vilanterol (ANORO ELLIPTA) 62.5-25 MCG/INH AEPB Inhale 1 puff into the lungs daily. (Patient not taking: Reported on 01/01/2019) 1 each 2  . umeclidinium-vilanterol (ANORO ELLIPTA) 62.5-25 MCG/INH AEPB Inhale 1 puff into the lungs daily. (Patient not taking: Reported on 01/01/2019) 1 each 0   No facility-administered medications prior to visit.     Review of Systems  Constitutional: Negative for chills, fever, malaise/fatigue and weight loss.  HENT: Negative for hearing loss, sore throat and tinnitus.   Eyes: Negative for blurred vision and double vision.  Respiratory: Positive for cough, sputum production and shortness of breath. Negative for hemoptysis, wheezing and stridor.   Cardiovascular: Negative for chest pain, palpitations, orthopnea, leg swelling and PND.  Gastrointestinal: Negative for abdominal pain, constipation, diarrhea, heartburn, nausea and vomiting.  Genitourinary: Negative for dysuria, hematuria and urgency.  Musculoskeletal: Negative for joint pain and myalgias.  Skin: Negative for itching and rash.  Neurological: Negative for dizziness, tingling, weakness and headaches.  Endo/Heme/Allergies: Negative for environmental allergies. Does not bruise/bleed easily.  Psychiatric/Behavioral: Negative for depression. The patient is not nervous/anxious and does not have insomnia.   All other systems reviewed and are negative.    Objective:  Physical Exam Vitals signs reviewed.  Constitutional:      General: She is not in acute distress.     Appearance: She is well-developed.  HENT:     Head: Normocephalic and atraumatic.     Mouth/Throat:     Pharynx: No oropharyngeal exudate.  Eyes:     Conjunctiva/sclera: Conjunctivae normal.     Pupils: Pupils are equal, round, and reactive to light.  Neck:     Vascular: No JVD.     Trachea: No tracheal deviation.     Comments: Loss of supraclavicular fat Cardiovascular:     Rate and Rhythm: Normal rate and regular rhythm.     Heart sounds: S1 normal and S2 normal.     Comments: Distant heart tones Pulmonary:  Effort: No tachypnea or accessory muscle usage.     Breath sounds: No stridor. Decreased breath sounds (throughout all lung fields) and wheezing present. No rhonchi or rales.     Comments: Few faint wheezes Abdominal:     General: Bowel sounds are normal. There is no distension.     Palpations: Abdomen is soft.     Tenderness: There is no abdominal tenderness.  Musculoskeletal:        General: Deformity (muscle wasting ) present.  Skin:    General: Skin is warm and dry.     Capillary Refill: Capillary refill takes less than 2 seconds.     Findings: No rash.  Neurological:     Mental Status: She is alert and oriented to person, place, and time.  Psychiatric:        Behavior: Behavior normal.      Vitals:   01/01/19 0941  BP: 128/70  Pulse: 69  SpO2: 100%  Weight: 115 lb 3.2 oz (52.3 kg)  Height: 5\' 4"  (1.626 m)   100% on RA BMI Readings from Last 3 Encounters:  01/01/19 19.77 kg/m  11/13/18 19.71 kg/m   Wt Readings from Last 3 Encounters:  01/01/19 115 lb 3.2 oz (52.3 kg)  11/13/18 114 lb 12.8 oz (52.1 kg)     CBC    Component Value Date/Time   WBC 11.7 (H) 11/07/2018 0627   RBC 4.84 11/07/2018 0627   HGB 14.8 11/07/2018 0627   HCT 48.2 (H) 11/07/2018 0627   PLT 233 11/07/2018 0627   MCV 99.6 11/07/2018 0627   MCH 30.6 11/07/2018 0627   MCHC 30.7 11/07/2018 0627   RDW 12.6 11/07/2018 0627   LYMPHSABS 1.2 11/07/2018 0627   MONOABS 1.0  11/07/2018 0627   EOSABS 0.1 11/07/2018 0627   BASOSABS 0.0 11/07/2018 0627   Chest Imaging: 11/07/2018 chest x-ray: Flattening of the bilateral diaphragms, slender cardiac silhouette, hyperinflation, significant bone loss in the vertebral bodies The patient's images have been independently reviewed by me.   Pulmonary Functions Testing Results: PFT Results Latest Ref Rng & Units 12/04/2018  FVC-Pre L 2.53  FVC-Predicted Pre % 82  FVC-Post L 2.89  FVC-Predicted Post % 94  Pre FEV1/FVC % % 49  Post FEV1/FCV % % 51  FEV1-Pre L 1.25  FEV1-Predicted Pre % 53  FEV1-Post L 1.48  DLCO UNC% % 49  DLCO COR %Predicted % 51  TLC L 6.13  TLC % Predicted % 121  RV % Predicted % 162    FeNO: None   Pathology: None   Echocardiogram: None   Heart Catheterization: None     Assessment & Plan:   COPD, moderate (HCC)  SOB (shortness of breath)  Cough  Discussion:  This is a 68 year old female with a significant smoking history, 50+ years at 1.5 packs/day.  PFTs consistent with moderate COPD with a postbronchodilator FEV1 of 63% predicted.  She is unable to afford her Anoro Ellipta.  Therefore we will work to obtain approval through [employer] financial assistance to help afford the medication.  We will recommend the following:  Continue on Anoro Ellipta Continue vitamin D supplementation 2000 units daily Patient must quit smoking. We will enroll patient in low-dose lung cancer screening program.  Greater than 50% of this patient's 25-minute office visit was spent face-to-face discussing above recommendations and treatment plan.   Current Outpatient Medications:  .  atorvastatin (LIPITOR) 10 MG tablet, Take 10 mg by mouth at bedtime., Disp: , Rfl: 11 .  escitalopram (LEXAPRO) 10 MG tablet, Take 10 mg by mouth daily., Disp: , Rfl: 11 .  lisinopril (PRINIVIL,ZESTRIL) 20 MG tablet, Take 20 mg by mouth daily., Disp: , Rfl: 11 .  Vitamin D, Cholecalciferol, 50 MCG (2000 UT) CAPS, Take 2,000  Units by mouth daily., Disp: 30 capsule, Rfl: 0   Josephine IgoBradley L Icard, DO [facility] 01/01/2019 9:58 AM

## 2019-01-05 ENCOUNTER — Other Ambulatory Visit: Payer: Self-pay | Admitting: Acute Care

## 2019-01-05 DIAGNOSIS — F17210 Nicotine dependence, cigarettes, uncomplicated: Principal | ICD-10-CM

## 2019-01-05 DIAGNOSIS — Z87891 Personal history of nicotine dependence: Secondary | ICD-10-CM

## 2019-01-05 DIAGNOSIS — Z122 Encounter for screening for malignant neoplasm of respiratory organs: Secondary | ICD-10-CM

## 2019-01-06 ENCOUNTER — Telehealth: Payer: Self-pay | Admitting: Pulmonary Disease

## 2019-01-06 NOTE — Telephone Encounter (Signed)
[place], please look out for this form that was placed in Dr. Tonia Brooms box.

## 2019-01-07 NOTE — Telephone Encounter (Signed)
PCCM: Thanks will address.  [name] Igo, DO [facility] 01/07/2019 5:15 PM

## 2019-01-08 NOTE — Telephone Encounter (Signed)
Forwarding to [place] to f/u thanks

## 2019-01-12 NOTE — Telephone Encounter (Signed)
Form has been received and signed by Dr. Tonia Brooms, there is no updated form of insurance on file and we need a copy to send to Patient Assistance program. Left patient a message asking that she come by office so that we can make copies of insurance.

## 2019-01-12 NOTE — Telephone Encounter (Signed)
[place] - has this form been received? Thanks!

## 2019-01-13 ENCOUNTER — Telehealth: Payer: Self-pay

## 2019-01-13 NOTE — Telephone Encounter (Signed)
LMTCB. We need a update copy of insurance cards.

## 2019-01-15 NOTE — Telephone Encounter (Signed)
Called and left message for Patient to call or drop off copy of insurance cards at office.

## 2019-01-16 NOTE — Telephone Encounter (Signed)
Patient returning call.  She states the only card she has is Medicare.  This is already scanned in the system.  If need to reach her, CB [phone].

## 2019-01-16 NOTE — Telephone Encounter (Signed)
Called and left message for Patient to call back. 

## 2019-01-16 NOTE — Telephone Encounter (Signed)
Patient returning call.  She states the only card she has is Medicare.  This is already scanned in the system.  If need to reach her, CB [phone]. °

## 2019-01-19 NOTE — Telephone Encounter (Signed)
Patients only insurance card is Medicare, that was previously scanned in epic.  Will route to [place], to follow up

## 2019-01-20 NOTE — Telephone Encounter (Signed)
Noted. Nothing further is needed at this time.  

## 2019-01-26 ENCOUNTER — Encounter: Payer: Self-pay | Admitting: Acute Care

## 2019-01-26 ENCOUNTER — Other Ambulatory Visit: Payer: Self-pay

## 2019-01-26 ENCOUNTER — Ambulatory Visit (INDEPENDENT_AMBULATORY_CARE_PROVIDER_SITE_OTHER): Payer: Medicare Other | Admitting: Acute Care

## 2019-01-26 VITALS — BP 118/58 | HR 84 | Ht 64.0 in | Wt 115.0 lb

## 2019-01-26 DIAGNOSIS — F17210 Nicotine dependence, cigarettes, uncomplicated: Secondary | ICD-10-CM

## 2019-01-26 MED ORDER — UMECLIDINIUM-VILANTEROL 62.5-25 MCG/INH IN AEPB: 1.0000 | INHALATION_SPRAY | Freq: Every day | RESPIRATORY_TRACT | 11 refills | Status: DC

## 2019-01-26 NOTE — Progress Notes (Signed)
Shared Decision Making Visit Lung Cancer Screening Program [id number])   Eligibility:  Age 68 y.o.  Pack Years Smoking History Calculation 64 pack year smoking history (# packs/per year x # years smoked)  Recent History of coughing up blood  no  Unexplained weight loss? no ( >Than 15 pounds within the last 6 months )  Prior History Lung / other cancer no (Diagnosis within the last 5 years already requiring surveillance chest CT Scans).  Smoking Status Current Smoker  Former Smokers: Years since quit:NA  Quit Date: NA  Visit Components:  Discussion included one or more decision making aids. yes  Discussion included risk/benefits of screening. yes  Discussion included potential follow up diagnostic testing for abnormal scans. yes  Discussion included meaning and risk of over diagnosis. yes  Discussion included meaning and risk of False Positives. yes  Discussion included meaning of total radiation exposure. yes  Counseling Included:  Importance of adherence to annual lung cancer LDCT screening. yes  Impact of comorbidities on ability to participate in the program. yes  Ability and willingness to under diagnostic treatment. yes  Smoking Cessation Counseling:  Current Smokers:   Discussed importance of smoking cessation. yes  Information about tobacco cessation classes and interventions provided to patient. yes  Patient provided with "ticket" for LDCT Scan. yes  Symptomatic Patient. no  Counseling NA  Diagnosis Code: Tobacco Use Z72.0  Asymptomatic Patient yes  Counseling (Intermediate counseling: > three minutes counseling) [id number]  Former Smokers:   Discussed the importance of maintaining cigarette abstinence. yes  Diagnosis Code: Personal History of Nicotine Dependence. W29.562Z87.891  Information about tobacco cessation classes and interventions provided to patient. Yes  Patient provided with "ticket" for LDCT Scan. yes  Written Order for Lung Cancer  Screening with LDCT placed in Epic. Yes (CT Chest Lung Cancer Screening Low Dose W/O CM) [id number] Z12.2-Screening of respiratory organs Z87.891-Personal history of nicotine dependence  I have spent 25 minutes of face to face time with [name] Mccullough discussing the risks and benefits of lung cancer screening. We viewed a power point together that explained in detail the above noted topics. We paused at intervals to allow for questions to be asked and answered to ensure understanding.We discussed that the single most powerful action that she can take to decrease her risk of developing lung cancer is to quit smoking. We discussed whether or not she is ready to commit to setting a quit date. We discussed options for tools to aid in quitting smoking including nicotine replacement therapy, non-nicotine medications, support groups, Quit Smart classes, and behavior modification. We discussed that often times setting smaller, more achievable goals, such as eliminating 1 cigarette a day for a week and then 2 cigarettes a day for a week can be helpful in slowly decreasing the number of cigarettes smoked. This allows for a sense of accomplishment as well as providing a clinical benefit. I gave her the " Be Stronger Than Your Excuses" card with contact information for community resources, classes, free nicotine replacement therapy, and access to mobile apps, text messaging, and on-line smoking cessation help. I have also given her my card and contact information in the event she needs to contact me. We discussed the time and location of the scan, and that either Abigail Miyamotoenise Phelps RN or I will call with the results within 24-48 hours of receiving them. I have offered her  a copy of the power point we viewed  as a resource in the event they need reinforcement of  the concepts we discussed today in the office. The patient verbalized understanding of all of  the above and had no further questions upon leaving the office. They have my  contact information in the event they have any further questions.  I spent 3 minutes counseling on smoking cessation and the health risks of continued tobacco abuse.  I explained to the patient that there has been a high incidence of coronary artery disease noted on these exams. I explained that this is a non-gated exam therefore degree or severity cannot be determined. This patient is currently on statin therapy. I have asked the patient to follow-up with their PCP regarding any incidental finding of coronary artery disease and management with diet or medication as their PCP  feels is clinically indicated. The patient verbalized understanding of the above and had no further questions upon completion of the visit.      Bevelyn NgoSarah F Rmoni Keplinger, NP 01/26/2019 4:31 PM

## 2019-01-27 ENCOUNTER — Ambulatory Visit (INDEPENDENT_AMBULATORY_CARE_PROVIDER_SITE_OTHER)
Admission: RE | Admit: 2019-01-27 | Discharge: 2019-01-27 | Disposition: A | Discharge status: Home / Self Care | Payer: Medicare Other | Source: Ambulatory Visit | Attending: Acute Care | Admitting: Acute Care

## 2019-01-27 DIAGNOSIS — Z122 Encounter for screening for malignant neoplasm of respiratory organs: Secondary | ICD-10-CM | POA: Diagnosis not present

## 2019-01-27 DIAGNOSIS — Z87891 Personal history of nicotine dependence: Secondary | ICD-10-CM

## 2019-01-27 DIAGNOSIS — F17210 Nicotine dependence, cigarettes, uncomplicated: Secondary | ICD-10-CM

## 2019-01-29 ENCOUNTER — Telehealth: Payer: Self-pay | Admitting: Pulmonary Disease

## 2019-01-29 DIAGNOSIS — Z87891 Personal history of nicotine dependence: Secondary | ICD-10-CM

## 2019-01-29 DIAGNOSIS — F17210 Nicotine dependence, cigarettes, uncomplicated: Principal | ICD-10-CM

## 2019-01-29 DIAGNOSIS — Z122 Encounter for screening for malignant neoplasm of respiratory organs: Secondary | ICD-10-CM

## 2019-01-29 NOTE — Telephone Encounter (Signed)
Patient is returning Denise's call but did not know that our call number shows up as [employer]....she is requesting a call back once again this time she will answer now that she know our [facility].

## 2019-01-29 NOTE — Telephone Encounter (Signed)
Message routed to Denise 

## 2019-01-29 NOTE — Telephone Encounter (Signed)
LMTC x 1  

## 2019-02-03 NOTE — Telephone Encounter (Signed)
Pt informed of CT results per Sarah Groce, NP.  PT verbalized understanding.  Copy sent to PCP.  Order placed for 1 yr f/u CT.  

## 2020-02-14 ENCOUNTER — Ambulatory Visit: Payer: Medicare Other | Attending: Internal Medicine

## 2020-02-14 DIAGNOSIS — Z23 Encounter for immunization: Secondary | ICD-10-CM | POA: Insufficient documentation

## 2020-02-14 NOTE — Progress Notes (Signed)
   Covid-19 Vaccination Clinic  Name:  GRETTEL RAMES    MRN: [id number] DOB: 06-10-1951  02/14/2020  Ms. Blanchard was observed post Covid-19 immunization for 15 minutes without incidence. She was provided with Vaccine Information Sheet and instruction to access the V-Safe system.   Ms. Mendia was instructed to call 911 with any severe reactions post vaccine: Marland Kitchen Difficulty breathing  . Swelling of your face and throat  . A fast heartbeat  . A bad rash all over your body  . Dizziness and weakness    Immunizations Administered    Name Date Dose VIS Date Route   Pfizer COVID-19 Vaccine 02/14/2020  8:20 AM 0.3 mL 12/04/2019 Intramuscular   Manufacturer: [employer], [employer]   Lot: [id number]   NDC: [address]-8

## 2020-03-08 ENCOUNTER — Ambulatory Visit: Payer: Medicare Other | Attending: Internal Medicine

## 2020-03-08 DIAGNOSIS — Z23 Encounter for immunization: Secondary | ICD-10-CM

## 2020-03-08 NOTE — Progress Notes (Signed)
   Covid-19 Vaccination Clinic  Name:  [name] Mccullough    MRN: [id number] DOB: 1951-12-17  03/08/2020  [name] was observed post Covid-19 immunization for 15 minutes without incident. She was provided with Vaccine Information Sheet and instruction to access the V-Safe system.   [name] was instructed to call 911 with any severe reactions post vaccine: Marland Kitchen Difficulty breathing  . Swelling of face and throat  . A fast heartbeat  . A bad rash all over body  . Dizziness and weakness   Immunizations Administered    Name Date Dose VIS Date Route   Pfizer COVID-19 Vaccine 03/08/2020  2:56 PM 0.3 mL 12/04/2019 Intramuscular   Manufacturer: [employer], [employer]   Lot: [id number]   NDC: [address]-7

## 2020-06-17 ENCOUNTER — Other Ambulatory Visit: Payer: Self-pay | Admitting: Family Medicine

## 2020-06-21 ENCOUNTER — Other Ambulatory Visit: Payer: Self-pay | Admitting: Family Medicine

## 2020-06-21 DIAGNOSIS — Z1231 Encounter for screening mammogram for malignant neoplasm of breast: Secondary | ICD-10-CM

## 2020-07-08 NOTE — Progress Notes (Signed)
Patient referred by Buzzy Han* for suspected paroxysmal Afib  Subjective:   [name] Mccullough, female    DOB: 01/09/1951, 69 y.o.   MRN: [id number]   Chief Complaint  Patient presents with   Atrial Fibrillation   New Patient (Initial Visit)    HPI  69 y.o. Caucasian female with hypertension, hyperlipidemia, tobacco dependence, coronary atherosclerosis, depression,  referred for evaluation of possible paroxysmal Afib.  Patient recently saw irregular heart rhythm on pulse monitor function.  She denies having any symptoms of chest pain, shortness of, palpitations associated with it.  She works at [employer], [employer].  She occasionally walks for short distances with her dog, but does not do any other regular physical activity.  She states that she has had diagnosis of stomach ulcer for a long time, but denies any melena.  She reports epigastric and lower chest pain lasting for 3-4 days after spicy food.  She is to take Zantac, and tolerated well.  However, she has not tolerated pantoprazole well.  She has baseline stable exertional dyspnea, with no recent change.  Patient is a 7 PY smoker, still continues to smoke. She had lung cancel CT scan in 01/2019 that showed noncalcified pulmonary nodules and three vessel atherosclerosis. She was recommended repeat CT chest in 01/2020, which she is yet to undergo this.  Patient states that she "can only do 1 thing at a time".   Past Medical History:  Diagnosis Date   HLD (hyperlipidemia)    Hypertension    Tobacco abuse      Past Surgical History:  Procedure Laterality Date   CHOLECYSTECTOMY       Social History   Tobacco Use  Smoking Status Current Every Day Smoker   Packs/day: 1.25   Years: 51.00   Pack years: 63.75   Types: Cigarettes  Smokeless Tobacco Never Used  Tobacco Comment   pack per day 11.21.19    Social History   Substance and Sexual Activity  Alcohol Use Yes   Comment:  occasional, social      Family History  Problem Relation Age of Onset   Heart disease Mother    Cancer Father        unknown  kind      Current Outpatient Medications on File Prior to Visit  Medication Sig Dispense Refill   amLODipine (NORVASC) 10 MG tablet Take 10 mg by mouth daily.     atorvastatin (LIPITOR) 10 MG tablet Take 10 mg by mouth at bedtime.  11   escitalopram (LEXAPRO) 10 MG tablet Take 20 mg by mouth daily.   11   Multiple Vitamins-Minerals (QC MULTI-VITE 50 & OVER) TABS Take by mouth.     omeprazole (PRILOSEC) 20 MG capsule Take 20 mg by mouth daily.     ranitidine (ZANTAC) 75 MG tablet Take 75 mg by mouth 2 (two) times daily.     SPIRIVA HANDIHALER 18 MCG inhalation capsule Place 1 mcg into inhaler and inhale daily.     SYMBICORT 80-4.5 MCG/ACT inhaler Inhale 1 puff into the lungs 2 (two) times daily.     umeclidinium-vilanterol (ANORO ELLIPTA) 62.5-25 MCG/INH AEPB Inhale 1 puff into the lungs daily. 2 each 0   umeclidinium-vilanterol (ANORO ELLIPTA) 62.5-25 MCG/INH AEPB Inhale 1 puff into the lungs daily. 1 each 11   Vitamin D, Cholecalciferol, 50 MCG (2000 UT) CAPS Take 2,000 Units by mouth daily. 30 capsule 0   No current facility-administered medications on file prior to visit.  Cardiovascular and other pertinent studies:  EKG 07/11/2020: Sinus rhythm 77 bpm Anteroseptal infarct -age undetermined   Intraventricular conduction delay  EKG 06/16/2020: Normal Sinus Rhythm, Septal infarct, age undetermind, Abnormal ECG  CT Chest 01/2019: 1. Lung-RADS 2, benign appearance or behavior. Continue annual screening with low-dose chest CT without contrast in 12 months. 2. Three-vessel coronary atherosclerosis.   Recent labs: 12/08/2019: Glucose 86, BUN/Cr 13/0.72. EGFR 85. Na/K 141/3.9. Rest of the CMP normal   Review of Systems  Cardiovascular: Positive for irregular heartbeat. Negative for chest pain, dyspnea on exertion, leg swelling,  palpitations and syncope.         Vitals:   07/11/20 0841 07/11/20 0852  BP: (!) 159/76 (!) 141/72  Pulse: 82 81  SpO2: 99%      Body mass index is 19.74 kg/m. Filed Weights   07/11/20 0841  Weight: 115 lb (52.2 kg)     Objective:   Physical Exam Vitals and nursing note reviewed.  Constitutional:      General: She is not in acute distress. Neck:     Vascular: No JVD.  Cardiovascular:     Rate and Rhythm: Normal rate and regular rhythm.     Heart sounds: Normal heart sounds. No murmur heard.   Pulmonary:     Effort: Pulmonary effort is normal. Prolonged expiration present.     Breath sounds: Normal breath sounds. No wheezing or rales.        Assessment & Recommendations:   69 y.o. Caucasian female with hypertension, hyperlipidemia, tobacco dependence, coronary atherosclerosis, depression, now with suspected Afib, chest pain, shortness of breath  Suspected Afib: Resting EKG shows sinus rhythm. Recommend echocardiogram and two week telemetry.   CAD: Three vessel atherosclerosis noted on CT chest 01/2019.  Recommend exercise nuclear stress test for the stratification.  Tobacco dependence: Tobacco cessation counseling:  - Currently smoking <1 packs/day   - Patient was informed of the dangers of tobacco abuse including stroke, cancer, and MI, as well as benefits of tobacco cessation. - Patient is not willing to quit at this time. - Approximately 5 mins were spent counseling patient cessation techniques. We discussed various methods to help quit smoking, including deciding on a date to quit, joining a support group, pharmacological agents.  - I will reassess her progress at the next follow-up visit  She needs repeat CT chest to monitor lung nodules, as seen on CT chest in 01/2019. Defer this to PCP.   Hypertension: Blood pressure mildly elevated today.  Recheck at next visit.  Hyperlipidemia: Reported history, currently on lipitor 10. Will check lipid panel.      Thank you for referring the patient to [place]. Please feel free to contact with any questions.  Nigel Mormon, MD [facility] Pager: [phone] Office: [phone]

## 2020-07-11 ENCOUNTER — Other Ambulatory Visit: Payer: Self-pay

## 2020-07-11 ENCOUNTER — Ambulatory Visit: Payer: Medicare Other | Admitting: Cardiology

## 2020-07-11 ENCOUNTER — Encounter: Payer: Self-pay | Admitting: Cardiology

## 2020-07-11 VITALS — BP 141/72 | HR 81 | Ht 64.0 in | Wt 115.0 lb

## 2020-07-11 DIAGNOSIS — F17200 Nicotine dependence, unspecified, uncomplicated: Secondary | ICD-10-CM

## 2020-07-11 DIAGNOSIS — I4821 Permanent atrial fibrillation: Secondary | ICD-10-CM

## 2020-07-11 DIAGNOSIS — I10 Essential (primary) hypertension: Secondary | ICD-10-CM

## 2020-07-11 DIAGNOSIS — I2510 Atherosclerotic heart disease of native coronary artery without angina pectoris: Secondary | ICD-10-CM

## 2020-07-11 DIAGNOSIS — I480 Paroxysmal atrial fibrillation: Secondary | ICD-10-CM

## 2020-07-11 DIAGNOSIS — R079 Chest pain, unspecified: Secondary | ICD-10-CM | POA: Insufficient documentation

## 2020-07-11 DIAGNOSIS — E782 Mixed hyperlipidemia: Secondary | ICD-10-CM | POA: Insufficient documentation

## 2020-07-11 DIAGNOSIS — I499 Cardiac arrhythmia, unspecified: Secondary | ICD-10-CM

## 2020-07-14 ENCOUNTER — Other Ambulatory Visit: Payer: Self-pay

## 2020-07-14 ENCOUNTER — Ambulatory Visit: Payer: Medicare Other

## 2020-07-14 DIAGNOSIS — I10 Essential (primary) hypertension: Secondary | ICD-10-CM

## 2020-07-25 ENCOUNTER — Ambulatory Visit: Payer: Medicare Other

## 2020-07-25 ENCOUNTER — Telehealth: Payer: Self-pay

## 2020-07-25 ENCOUNTER — Other Ambulatory Visit: Payer: Self-pay

## 2020-07-25 DIAGNOSIS — I2510 Atherosclerotic heart disease of native coronary artery without angina pectoris: Secondary | ICD-10-CM

## 2020-07-25 DIAGNOSIS — I4821 Permanent atrial fibrillation: Secondary | ICD-10-CM

## 2020-07-25 DIAGNOSIS — I499 Cardiac arrhythmia, unspecified: Secondary | ICD-10-CM

## 2020-07-25 NOTE — Telephone Encounter (Signed)
The card scanned in indicates on the back that it's NOT an insurance card or credit card.  There's no ID# on it as well.  & the card has her last name different from the [facility] card.  So, unless she can present a card w/an appropriate ID #, she's just going to be showing [facility] insurance only.

## 2020-07-26 NOTE — Telephone Encounter (Signed)
She said to me "I already spoke to you all about this, I have [place]" - I dont know what this means but she is certain she has [employer].

## 2020-07-26 NOTE — Telephone Encounter (Signed)
So, I added (just out of "past experiences") the [facility] as the secondary w/her SS# as the ID.  We'll see.Marland KitchenMarland Kitchen

## 2020-08-15 ENCOUNTER — Other Ambulatory Visit: Payer: Self-pay

## 2020-08-15 ENCOUNTER — Ambulatory Visit: Payer: Medicare Other | Admitting: Cardiology

## 2020-08-15 ENCOUNTER — Encounter: Payer: Self-pay | Admitting: Cardiology

## 2020-08-15 VITALS — BP 164/68 | HR 68 | Ht 64.0 in | Wt 114.0 lb

## 2020-08-15 DIAGNOSIS — F17200 Nicotine dependence, unspecified, uncomplicated: Secondary | ICD-10-CM

## 2020-08-15 DIAGNOSIS — I10 Essential (primary) hypertension: Secondary | ICD-10-CM

## 2020-08-15 DIAGNOSIS — I2510 Atherosclerotic heart disease of native coronary artery without angina pectoris: Secondary | ICD-10-CM | POA: Insufficient documentation

## 2020-08-15 DIAGNOSIS — I499 Cardiac arrhythmia, unspecified: Secondary | ICD-10-CM

## 2020-08-15 MED ORDER — ATORVASTATIN CALCIUM 40 MG PO TABS: 40.0000 mg | ORAL_TABLET | Freq: Every day | ORAL | 2 refills | Status: DC

## 2020-08-15 MED ORDER — ASPIRIN EC 81 MG PO TBEC: 81.0000 mg | DELAYED_RELEASE_TABLET | Freq: Every day | ORAL | 3 refills | Status: AC

## 2020-08-15 MED ORDER — LOSARTAN POTASSIUM 50 MG PO TABS: 50.0000 mg | ORAL_TABLET | Freq: Every day | ORAL | 3 refills | Status: DC

## 2020-08-15 NOTE — Progress Notes (Signed)
Patient referred by Buzzy Han* for suspected paroxysmal Afib  Subjective:   [name] Mccullough, female    DOB: [birth year], 69 y.o.   MRN: [id number]   Chief Complaint  Patient presents with  . Irregular Heart Beat  . Follow-up  . Results    echo and stress    HPI  69 y.o. Caucasian female with hypertension, hyperlipidemia, tobacco dependence, coronary atherosclerosis, depression, suspected Afib  Patient recently underwent echocardiogram and nuclear stress testing, details below.  Results discussed with the patient. Cardiac telemetry was recommended to evaluate for Afib given that her smart watch had detected "irregular heart beat".   Patient denies any chest pain. She reports she uses to have epigastric pain only after eating spicy food, and has completely resolved with Zantac.   Initial consultation HPI 06/2020: Patient recently saw irregular heart rhythm on pulse monitor function.  She denies having any symptoms of chest pain, shortness of, palpitations associated with it.  She works at [employer], [employer].  She occasionally walks for short distances with her dog, but does not do any other regular physical activity.  She states that she has had diagnosis of stomach ulcer for a long time, but denies any melena.  She reports epigastric and lower chest pain lasting for 3-4 days after spicy food.  She is to take Zantac, and tolerated well.  However, she has not tolerated pantoprazole well.  She has baseline stable exertional dyspnea, with no recent change.  Patient is a 50 PY smoker, still continues to smoke. She had lung cancel CT scan in 01/2019 that showed noncalcified pulmonary nodules and three vessel atherosclerosis. She was recommended repeat CT chest in 01/2020, which she is yet to undergo this.  Patient states that she "can only do 1 thing at a time".    Current Outpatient Medications on File Prior to Visit  Medication Sig Dispense Refill  . amLODipine  (NORVASC) 10 MG tablet Take 10 mg by mouth daily.    Marland Kitchen atorvastatin (LIPITOR) 10 MG tablet Take 10 mg by mouth at bedtime.  11  . escitalopram (LEXAPRO) 10 MG tablet Take 20 mg by mouth daily.   11  . Multiple Vitamins-Minerals (QC MULTI-VITE 50 & OVER) TABS Take by mouth.    Marland Kitchen omeprazole (PRILOSEC) 20 MG capsule Take 20 mg by mouth daily.    . ranitidine (ZANTAC) 75 MG tablet Take 75 mg by mouth 2 (two) times daily.    Marland Kitchen SPIRIVA HANDIHALER 18 MCG inhalation capsule Place 1 mcg into inhaler and inhale daily.    . SYMBICORT 80-4.5 MCG/ACT inhaler Inhale 1 puff into the lungs 2 (two) times daily.    Marland Kitchen umeclidinium-vilanterol (ANORO ELLIPTA) 62.5-25 MCG/INH AEPB Inhale 1 puff into the lungs daily. 2 each 0  . umeclidinium-vilanterol (ANORO ELLIPTA) 62.5-25 MCG/INH AEPB Inhale 1 puff into the lungs daily. 1 each 11  . Vitamin D, Cholecalciferol, 50 MCG (2000 UT) CAPS Take 2,000 Units by mouth daily. 30 capsule 0   No current facility-administered medications on file prior to visit.    Cardiovascular and other pertinent studies:  Lexiscan/modified bruce Tetrofosmin stress test 07/26/2020: Lexiscan/modified Bruce nuclear stress test performed using 1-day protocol. Stress symptoms include dyspnea, dizziness, nausea. No chest pain reported.  Stress EKG is non-diagnostic, as this is pharmacological stress test. In addition, stress EKG at 80% MPHR showed sinus tachycardia, IVCD, possible old anteroseptal infarct, 1-1.5 horizontal/down-sloping ST depression in leads II, III, aVF, V5-V6; partially normalize 2 min into  recovery. Normal myocardial perfusion. Stress LVEF 73%. Low risk study.  Echocardiogram 07/14/2020:  Left ventricle cavity is normal in size. Mild concentric hypertrophy of  the left ventricle. Normal global wall motion. Normal LV systolic function  with visual EF 50-55%. Normal diastolic filling pattern.  Mild to moderate mitral regurgitation.  Moderate tricuspid regurgitation. Estimated  pulmonary artery systolic  pressure 44 mmHg. RVSP measures 44 mmHg.   EKG 07/11/2020: Sinus rhythm 77 bpm Anteroseptal infarct -age undetermined   Intraventricular conduction delay  EKG 06/16/2020: Normal Sinus Rhythm, Septal infarct, age undetermind, Abnormal ECG  CT Chest 01/2019: 1. Lung-RADS 2, benign appearance or behavior. Continue annual screening with low-dose chest CT without contrast in 12 months. 2. Three-vessel coronary atherosclerosis.   Recent labs: 12/08/2019: Glucose 86, BUN/Cr 13/0.72. EGFR 85. Na/K 141/3.9. Rest of the CMP normal   Review of Systems  Cardiovascular: Positive for irregular heartbeat. Negative for chest pain, dyspnea on exertion, leg swelling, palpitations and syncope.         Vitals:   08/15/20 1048  BP: (!) 164/68  Pulse: 68  SpO2: 99%     Body mass index is 19.57 kg/m. Filed Weights   08/15/20 1048  Weight: 114 lb (51.7 kg)     Objective:   Physical Exam Vitals and nursing note reviewed.  Constitutional:      General: She is not in acute distress. Neck:     Vascular: No JVD.  Cardiovascular:     Rate and Rhythm: Normal rate and regular rhythm.     Heart sounds: Normal heart sounds. No murmur heard.   Pulmonary:     Effort: Pulmonary effort is normal. Prolonged expiration present.     Breath sounds: Normal breath sounds. No wheezing or rales.        Assessment & Recommendations:   70 y.o. Caucasian female with hypertension, hyperlipidemia, tobacco dependence, coronary atherosclerosis, depression, suspected Afib   CAD: Three vessel atherosclerosis noted on CT chest 01/2019.  No ischemia seen on SPET imaging. However, EKG did have ischemia change. 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 Given her elevated risk, in light of ongoing tobacco dependence,  I recommend Aspirin 81 mg daily. Will need to monitor for any signs of bleeding., Will check CBC in 2 weeks.   Suspected Afib: Resting EKG shows sinus  rhythm. Cardiac telemetry report pending.   CAD: Three vessel atherosclerosis noted on CT chest 01/2019.  Recommend exercise nuclear stress test for the stratification.  Tobacco dependence: Tobacco cessation counseling:  - Currently smoking <1 packs/day   - Patient was informed of the dangers of tobacco abuse including stroke, cancer, and MI, as well as benefits of tobacco cessation. - Patient is not willing to quit at this time. - Approximately 5 mins were spent counseling patient cessation techniques. We discussed various methods to help quit smoking, including deciding on a date to quit, joining a support group, pharmacological agents.  - I will reassess her progress at the next follow-up visit  She needs repeat CT chest to monitor lung nodules, as seen on CT chest in 01/2019. Defer this to PCP.   Hypertension: Added losartan 50 mg daily. Check BMP in 2 weeks  Hyperlipidemia: In light of CAD, increased Lipitor to 80 mg daily. Check lipid panel in 2 weeks  F/u in 4 weeks   [place], MD [facility] Pager: [phone] Office: [phone]

## 2020-08-23 LAB — CBC
Hematocrit: 41.8 % (ref 34.0–46.6)
Hemoglobin: 14.6 g/dL (ref 11.1–15.9)
MCH: 31.9 pg (ref 26.6–33.0)
MCHC: 34.9 g/dL (ref 31.5–35.7)
MCV: 92 fL (ref 79–97)
Platelets: 292 10*3/uL (ref 150–450)
RBC: 4.57 x10E6/uL (ref 3.77–5.28)
RDW: 11.7 % (ref 11.7–15.4)
WBC: 8.4 10*3/uL (ref 3.4–10.8)

## 2020-09-14 NOTE — Progress Notes (Signed)
Patient referred by Buzzy Han* for suspected paroxysmal Afib  Subjective:   [name] Mccullough, female    DOB: [birth year], 69 y.o.   MRN: [id number]   Chief Complaint  Patient presents with   Coronary Artery Disease   Follow-up    4 week    HPI  69 y.o. Caucasian female with hypertension, hyperlipidemia, tobacco dependence, coronary atherosclerosis, depression,   Patient denies any chest pain symptoms. Since her last visit, she was taking only losartan, but not amlodipine-for hypertension. Blood pressure is elevated today.      Current Outpatient Medications on File Prior to Visit  Medication Sig Dispense Refill   amLODipine (NORVASC) 10 MG tablet Take 10 mg by mouth daily.     aspirin EC 81 MG tablet Take 1 tablet (81 mg total) by mouth daily. Swallow whole. 30 tablet 3   atorvastatin (LIPITOR) 40 MG tablet Take 1 tablet (40 mg total) by mouth at bedtime. 30 tablet 2   escitalopram (LEXAPRO) 10 MG tablet Take 20 mg by mouth daily.   11   losartan (COZAAR) 50 MG tablet Take 1 tablet (50 mg total) by mouth daily. 30 tablet 3   Multiple Vitamins-Minerals (QC MULTI-VITE 50 & OVER) TABS Take by mouth.     ranitidine (ZANTAC) 75 MG tablet Take 75 mg by mouth 2 (two) times daily.     SPIRIVA HANDIHALER 18 MCG inhalation capsule Place 1 mcg into inhaler and inhale daily.     SYMBICORT 80-4.5 MCG/ACT inhaler Inhale 1 puff into the lungs 2 (two) times daily.     Vitamin D, Cholecalciferol, 50 MCG (2000 UT) CAPS Take 2,000 Units by mouth daily. 30 capsule 0   No current facility-administered medications on file prior to visit.    Cardiovascular and other pertinent studies:  Long term monitor 14 days 07/25/2020 - 08/08/2020: Dominant rhythm: Sinus. HR 53-184 bpm. Avg HR 75 bpm. 6 episodes of SVT, fastest at 184 bpm for 6 beats, longest for 9 beats at 112 bpm. 1.8% SVE burden. These episodes were not associated with symptoms <1% VE burden. No atrial  fibrillation/atrial flutter/VT/high grade AV block, sinus pause >3sec noted. 4 patient triggered events correlated with sinus rhythm/artifact.     Lexiscan/modified bruce Tetrofosmin stress test 07/26/2020: Lexiscan/modified Bruce nuclear stress test performed using 1-day protocol. Stress symptoms include dyspnea, dizziness, nausea. No chest pain reported.  Stress EKG is non-diagnostic, as this is pharmacological stress test. In addition, stress EKG at 80% MPHR showed sinus tachycardia, IVCD, possible old anteroseptal infarct, 1-1.5 horizontal/down-sloping ST depression in leads II, III, aVF, V5-V6; partially normalize 2 min into recovery. Normal myocardial perfusion. Stress LVEF 73%. Low risk study.  Echocardiogram 07/14/2020:  Left ventricle cavity is normal in size. Mild concentric hypertrophy of  the left ventricle. Normal global wall motion. Normal LV systolic function  with visual EF 50-55%. Normal diastolic filling pattern.  Mild to moderate mitral regurgitation.  Moderate tricuspid regurgitation. Estimated pulmonary artery systolic  pressure 44 mmHg. RVSP measures 44 mmHg.   EKG 07/11/2020: Sinus rhythm 77 bpm Anteroseptal infarct -age undetermined   Intraventricular conduction delay  EKG 06/16/2020: Normal Sinus Rhythm, Septal infarct, age undetermind, Abnormal ECG  CT Chest 01/2019: 1. Lung-RADS 2, benign appearance or behavior. Continue annual screening with low-dose chest CT without contrast in 12 months. 2. Three-vessel coronary atherosclerosis.   Recent labs: 08/22/2020: H/H 14/41. MCV 92. Platelets 292  12/08/2019: Glucose 86, BUN/Cr 13/0.72. EGFR 85. Na/K 141/3.9. Rest of the CMP normal  Review of Systems  Cardiovascular: Positive for irregular heartbeat. Negative for chest pain, dyspnea on exertion, leg swelling, palpitations and syncope.         Vitals:   09/15/20 1018  BP: (!) 173/78  Pulse: 71  Resp: 16  SpO2: 97%     Body mass index is 20.08  kg/m. Filed Weights   09/15/20 1018  Weight: 117 lb (53.1 kg)     Objective:   Physical Exam Vitals and nursing note reviewed.  Constitutional:      General: She is not in acute distress. Neck:     Vascular: No JVD.  Cardiovascular:     Rate and Rhythm: Normal rate and regular rhythm.     Heart sounds: Normal heart sounds. No murmur heard.   Pulmonary:     Effort: Pulmonary effort is normal. Prolonged expiration present.     Breath sounds: Normal breath sounds. No wheezing or rales.        Assessment & Recommendations:   69 y.o. Caucasian female with hypertension, hyperlipidemia, tobacco dependence, coronary atherosclerosis, depression,   CAD: Three vessel atherosclerosis noted on CT chest 01/2019.  No ischemia seen on SPET imaging. However, EKG did have ischemia change. 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 Given her elevated risk, in light of ongoing tobacco dependence,  I recommend Aspirin 81 mg daily. Will need to monitor for any signs of bleeding. Hb 14.  Hypertension: Recommend both amlodipine 10 mg and losartan 50 mg daily. Get labs from [facility]  Hyperlipidemia: In light of CAD, increased Lipitor to 80 mg daily. Get labs from PCP.  F/u in 4 weeks  [place], MD [facility] Pager: [phone] Office: [phone]

## 2020-09-15 ENCOUNTER — Other Ambulatory Visit: Payer: Self-pay

## 2020-09-15 ENCOUNTER — Encounter: Payer: Self-pay | Admitting: Cardiology

## 2020-09-15 ENCOUNTER — Ambulatory Visit: Payer: Medicare Other | Admitting: Cardiology

## 2020-09-15 VITALS — BP 173/78 | HR 71 | Resp 16 | Ht 64.0 in | Wt 117.0 lb

## 2020-09-15 DIAGNOSIS — I2510 Atherosclerotic heart disease of native coronary artery without angina pectoris: Secondary | ICD-10-CM

## 2020-09-15 DIAGNOSIS — I10 Essential (primary) hypertension: Secondary | ICD-10-CM

## 2020-09-15 MED ORDER — AMLODIPINE BESYLATE 10 MG PO TABS: 10.0000 mg | ORAL_TABLET | Freq: Every day | ORAL | 3 refills | Status: DC

## 2020-09-15 MED ORDER — LOSARTAN POTASSIUM 50 MG PO TABS: 50.0000 mg | ORAL_TABLET | Freq: Every day | ORAL | 3 refills | Status: DC

## 2020-10-13 ENCOUNTER — Ambulatory Visit: Payer: Medicare Other | Admitting: Cardiology

## 2020-10-20 ENCOUNTER — Ambulatory Visit: Payer: Medicare Other | Admitting: Cardiology

## 2020-10-20 ENCOUNTER — Other Ambulatory Visit: Payer: Self-pay

## 2020-10-20 ENCOUNTER — Encounter: Payer: Self-pay | Admitting: Cardiology

## 2020-10-20 VITALS — BP 135/57 | HR 70 | Resp 16 | Ht 64.0 in | Wt 116.0 lb

## 2020-10-20 DIAGNOSIS — E782 Mixed hyperlipidemia: Secondary | ICD-10-CM

## 2020-10-20 DIAGNOSIS — F17200 Nicotine dependence, unspecified, uncomplicated: Secondary | ICD-10-CM

## 2020-10-20 DIAGNOSIS — I2510 Atherosclerotic heart disease of native coronary artery without angina pectoris: Secondary | ICD-10-CM

## 2020-10-20 MED ORDER — ATORVASTATIN CALCIUM 40 MG PO TABS: 40.0000 mg | ORAL_TABLET | Freq: Every day | ORAL | 2 refills | Status: AC

## 2020-10-20 MED ORDER — BUPROPION HCL ER (SR) 100 MG PO TB12: 100.0000 mg | ORAL_TABLET | Freq: Two times a day (BID) | ORAL | 2 refills | Status: AC

## 2020-10-20 NOTE — Progress Notes (Signed)
Patient referred by Buzzy Han* for suspected paroxysmal Afib  Subjective:   [name] Mccullough, female    DOB: [birth year], 69 y.o.   MRN: [id number]   Chief Complaint  Patient presents with  . Coronary artery disease involving native coronary artery of   . Follow-up    70 week   . Hypertension    HPI  69 y.o. Caucasian female with hypertension, hyperlipidemia, tobacco dependence, coronary atherosclerosis, depression  Patient denies any chest pain symptoms. Blood pressure much better controlled today.     Current Outpatient Medications on File Prior to Visit  Medication Sig Dispense Refill  . amLODipine (NORVASC) 10 MG tablet Take 1 tablet (10 mg total) by mouth daily. 90 tablet 3  . aspirin EC 81 MG tablet Take 1 tablet (81 mg total) by mouth daily. Swallow whole. 30 tablet 3  . atorvastatin (LIPITOR) 40 MG tablet Take 1 tablet (40 mg total) by mouth at bedtime. 30 tablet 2  . escitalopram (LEXAPRO) 10 MG tablet Take 10 mg by mouth daily.   11  . losartan (COZAAR) 50 MG tablet Take 1 tablet (50 mg total) by mouth daily. 90 tablet 3  . Multiple Vitamins-Minerals (QC MULTI-VITE 50 & OVER) TABS Take by mouth.    . ranitidine (ZANTAC) 75 MG tablet Take 75 mg by mouth 2 (two) times daily.    . SYMBICORT 80-4.5 MCG/ACT inhaler Inhale 1 puff into the lungs 2 (two) times daily.    . Vitamin D, Cholecalciferol, 50 MCG (2000 UT) CAPS Take 2,000 Units by mouth daily. 30 capsule 0   No current facility-administered medications on file prior to visit.    Cardiovascular and other pertinent studies:  Long term monitor 14 days 07/25/2020 - 08/08/2020: Dominant rhythm: Sinus. HR 53-184 bpm. Avg HR 75 bpm. 6 episodes of SVT, fastest at 184 bpm for 6 beats, longest for 9 beats at 112 bpm. 1.8% SVE burden. These episodes were not associated with symptoms <1% VE burden. No atrial fibrillation/atrial flutter/VT/high grade AV block, sinus pause >3sec noted. 4 patient triggered  events correlated with sinus rhythm/artifact.     Lexiscan/modified bruce Tetrofosmin stress test 07/26/2020: Lexiscan/modified Bruce nuclear stress test performed using 1-day protocol. Stress symptoms include dyspnea, dizziness, nausea. No chest pain reported.  Stress EKG is non-diagnostic, as this is pharmacological stress test. In addition, stress EKG at 80% MPHR showed sinus tachycardia, IVCD, possible old anteroseptal infarct, 1-1.5 horizontal/down-sloping ST depression in leads II, III, aVF, V5-V6; partially normalize 2 min into recovery. Normal myocardial perfusion. Stress LVEF 73%. Low risk study.  Echocardiogram 07/14/2020:  Left ventricle cavity is normal in size. Mild concentric hypertrophy of  the left ventricle. Normal global wall motion. Normal LV systolic function  with visual EF 50-55%. Normal diastolic filling pattern.  Mild to moderate mitral regurgitation.  Moderate tricuspid regurgitation. Estimated pulmonary artery systolic  pressure 44 mmHg. RVSP measures 44 mmHg.   EKG 07/11/2020: Sinus rhythm 77 bpm Anteroseptal infarct -age undetermined   Intraventricular conduction delay  EKG 06/16/2020: Normal Sinus Rhythm, Septal infarct, age undetermind, Abnormal ECG  CT Chest 01/2019: 1. Lung-RADS 2, benign appearance or behavior. Continue annual screening with low-dose chest CT without contrast in 12 months. 2. Three-vessel coronary atherosclerosis.   Recent labs: 09/16/2020: Glucose 103, BUN/Cr 11/0.81. EGFR 74. Na/K 143/5.2. Rest of the CMP normal H/H 14/44. MCV 97. Platelets 262 Chol 146, TG 112, HDL 56, LDL 70  08/22/2020: H/H 14/41. MCV 92. Platelets 292  12/08/2019: Glucose 86,  BUN/Cr 13/0.72. EGFR 85. Na/K 141/3.9. Rest of the CMP normal   Review of Systems  Cardiovascular: Positive for irregular heartbeat. Negative for chest pain, dyspnea on exertion, leg swelling, palpitations and syncope.         Vitals:   70/28/21 1030  BP: (!) 135/57  Pulse:  70  Resp: 16  SpO2: 97%     Body mass index is 19.91 kg/m. Filed Weights   70/28/21 1030  Weight: 116 lb (52.6 kg)     Objective:   Physical Exam Vitals and nursing note reviewed.  Constitutional:      General: She is not in acute distress. Neck:     Vascular: No JVD.  Cardiovascular:     Rate and Rhythm: Normal rate and regular rhythm.     Heart sounds: Normal heart sounds. No murmur heard.   Pulmonary:     Effort: Pulmonary effort is normal. Prolonged expiration present.     Breath sounds: Normal breath sounds. No wheezing or rales.        Assessment & Recommendations:   69 y.o. Caucasian female with hypertension, hyperlipidemia, tobacco dependence, coronary atherosclerosis, depression,   CAD: Three vessel atherosclerosis noted on CT chest 01/2019.  No ischemia seen on SPET imaging. However, EKG did have ischemia change. 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 Given her elevated risk, in light of ongoing tobacco dependence,  I recommend Aspirin 81 mg daily. Will need to monitor for any signs of bleeding. Hb 14.  Hypertension: Well controlled on amlodipine 10 mg and losartan 50 mg daily.  Hyperlipidemia: LDL 70 on Lipitor to 80 mg daily.  Tobacco dependence: Tobacco cessation counseling:  - Currently smoking 1 packs/day   - Patient was informed of the dangers of tobacco abuse including stroke, cancer, and MI, as well as benefits of tobacco cessation. - Patient is willing to quit at this time. - Approximately 5 mins were spent counseling patient cessation techniques. We discussed various methods to help quit smoking, including deciding on a date to quit, joining a support group, pharmacological agents. Patient would like to use Wellbutrin. - I will reassess her progress at the next follow-up visit   F/u in 3 months  Joella Saefong Esther Hardy, MD [facility] Pager: [phone] Office: [phone]

## 2021-01-19 ENCOUNTER — Ambulatory Visit: Payer: Medicare Other | Admitting: Cardiology

## 2021-05-10 ENCOUNTER — Emergency Department (HOSPITAL_BASED_OUTPATIENT_CLINIC_OR_DEPARTMENT_OTHER): Payer: Medicare Other

## 2021-05-10 ENCOUNTER — Encounter (HOSPITAL_BASED_OUTPATIENT_CLINIC_OR_DEPARTMENT_OTHER): Payer: Self-pay

## 2021-05-10 ENCOUNTER — Emergency Department (HOSPITAL_BASED_OUTPATIENT_CLINIC_OR_DEPARTMENT_OTHER)
Admission: EM | Admit: 2021-05-10 | Discharge: 2021-05-10 | Disposition: A | Discharge status: Home / Self Care | Payer: Medicare Other | Attending: Emergency Medicine | Admitting: Emergency Medicine

## 2021-05-10 ENCOUNTER — Other Ambulatory Visit: Payer: Self-pay

## 2021-05-10 DIAGNOSIS — I2510 Atherosclerotic heart disease of native coronary artery without angina pectoris: Secondary | ICD-10-CM | POA: Insufficient documentation

## 2021-05-10 DIAGNOSIS — J441 Chronic obstructive pulmonary disease with (acute) exacerbation: Secondary | ICD-10-CM | POA: Diagnosis not present

## 2021-05-10 DIAGNOSIS — Z7982 Long term (current) use of aspirin: Secondary | ICD-10-CM | POA: Diagnosis not present

## 2021-05-10 DIAGNOSIS — R0602 Shortness of breath: Secondary | ICD-10-CM | POA: Diagnosis present

## 2021-05-10 DIAGNOSIS — F17210 Nicotine dependence, cigarettes, uncomplicated: Secondary | ICD-10-CM | POA: Diagnosis not present

## 2021-05-10 DIAGNOSIS — Z79899 Other long term (current) drug therapy: Secondary | ICD-10-CM | POA: Insufficient documentation

## 2021-05-10 DIAGNOSIS — I10 Essential (primary) hypertension: Secondary | ICD-10-CM | POA: Insufficient documentation

## 2021-05-10 MED ORDER — PREDNISONE 20 MG PO TABS: ORAL_TABLET | ORAL | 0 refills | Status: DC

## 2021-05-10 MED ORDER — PREDNISONE 20 MG PO TABS
40.0000 mg | ORAL_TABLET | Freq: Once | ORAL | Status: AC
  Administered 2021-05-10: 40 mg via ORAL
  Filled 2021-05-10: qty 2

## 2021-05-10 MED ORDER — ALBUTEROL SULFATE HFA 108 (90 BASE) MCG/ACT IN AERS
4.0000 | INHALATION_SPRAY | Freq: Once | RESPIRATORY_TRACT | Status: AC
  Administered 2021-05-10: 4 via RESPIRATORY_TRACT
  Filled 2021-05-10: qty 6.7

## 2021-05-10 NOTE — ED Triage Notes (Signed)
Patient here POV from Home with Congestion, Runny Nose, Cough, and SOB with Exertion.  Patient has been having these Cold-Like Symptoms for approximately 1 week.  Albuterol Inhaler no longer working. Hx of COPD.

## 2021-05-10 NOTE — Discharge Instructions (Addendum)
If you develop high fever, severe cough or cough with blood, trouble breathing, severe headache, neck pain/stiffness, vomiting, or any other new/concerning symptoms then return to the ER for evaluation   Use your albuterol inhaler 2 puffs every 4 hours as needed for cough or shortness of breath.

## 2021-05-10 NOTE — ED Notes (Signed)
This RN presented the AVS utilizing Teachback Method. Patient verbalizes understanding of Discharge Instructions. Opportunity for Questioning and Answers were provided. Patient Discharged from ED ambulatory to Home with Son.

## 2021-05-10 NOTE — ED Provider Notes (Signed)
[facility] EMERGENCY DEPT Provider Note   CSN: [id number] Arrival date & time: 05/10/21  2025     History Chief Complaint  Patient presents with  . Cough    [name] Mccullough is a 70 y.o. female.  HPI 70 year old female presents with shortness of breath.  For about 3 to 4 days she has been having cough, congestion and rhinorrhea, tickle in her throat.  She is also feeling short of breath.  She denies any chest pain or fever.  No leg swelling.  She tried a leftover albuterol inhaler but it was multiple years old and she is not sure it worked.  Past Medical History:  Diagnosis Date  . HLD (hyperlipidemia)   . Hypertension   . Tobacco abuse     Patient Active Problem List   Diagnosis Date Noted  . Coronary artery disease involving native coronary artery of native heart without angina pectoris 08/15/2020  . Irregular heart beat 07/11/2020  . Tobacco dependence 07/11/2020  . Mixed hyperlipidemia 07/11/2020  . Essential hypertension 07/11/2020  . Chest pain 07/11/2020    Past Surgical History:  Procedure Laterality Date  . CHOLECYSTECTOMY    . RECTAL SURGERY    . TONSILLECTOMY       OB History   No obstetric history on file.     Family History  Problem Relation Age of Onset  . Heart disease Mother   . Cancer Father        unknown  kind   . Hypertension Sister   . Alcohol abuse Brother   . Drug abuse Sister     Social History   Tobacco Use  . Smoking status: Current Every Day Smoker    Packs/day: 1.25    Years: 51.00    Pack years: 63.75    Types: Cigarettes  . Smokeless tobacco: Never Used  . Tobacco comment: pack per day 11.21.19  Vaping Use  . Vaping Use: Never used  Substance Use Topics  . Alcohol use: Yes    Comment: occasional, social   . Drug use: Never    Home Medications Prior to Admission medications   Medication Sig Start Date End Date Taking? Authorizing Provider  predniSONE (DELTASONE) 20 MG tablet 2 tabs po daily x 4  days 05/10/21  Yes Pricilla Loveless, MD  amLODipine (NORVASC) 10 MG tablet Take 1 tablet (10 mg total) by mouth daily. 09/15/20   Patwardhan, Anabel Bene, MD  aspirin EC 81 MG tablet Take 1 tablet (81 mg total) by mouth daily. Swallow whole. 08/15/20   Patwardhan, Anabel Bene, MD  atorvastatin (LIPITOR) 40 MG tablet Take 1 tablet (40 mg total) by mouth at bedtime. 10/20/20   Patwardhan, Anabel Bene, MD  buPROPion (WELLBUTRIN SR) 100 MG 12 hr tablet Take 1 tablet (100 mg total) by mouth 2 (two) times daily. 10/20/20   Patwardhan, Manish J, MD  escitalopram (LEXAPRO) 10 MG tablet Take 10 mg by mouth daily.  10/28/18   [provider]  losartan (COZAAR) 50 MG tablet Take 1 tablet (50 mg total) by mouth daily. 09/15/20 12/14/20  Patwardhan, Anabel Bene, MD  Multiple Vitamins-Minerals (QC MULTI-VITE 50 & OVER) TABS Take by mouth.    [provider]  ranitidine (ZANTAC) 75 MG tablet Take 75 mg by mouth 2 (two) times daily.    [provider]  SYMBICORT 80-4.5 MCG/ACT inhaler Inhale 1 puff into the lungs 2 (two) times daily. 06/16/20   [provider]  Vitamin D, Cholecalciferol, 50 MCG (  2000 UT) CAPS Take 2,000 Units by mouth daily. 11/13/18   Josephine Igo, DO    Allergies    Patient has no known allergies.  Review of Systems   Review of Systems  Constitutional: Negative for fever.  HENT: Positive for rhinorrhea and sore throat.   Respiratory: Positive for cough and shortness of breath.   Cardiovascular: Negative for chest pain.  All other systems reviewed and are negative.   Physical Exam Updated Vital Signs BP (!) 143/100 (BP Location: Left Arm)   Pulse 95   Temp 98.7 F (37.1 C) (Oral)   Resp 16   Ht 5\' 4"  (1.626 m)   Wt 51.7 kg   SpO2 100%   BMI 19.57 kg/m   Physical Exam Vitals and nursing note reviewed.  Constitutional:      General: She is not in acute distress.    Appearance: She is well-developed. She is not ill-appearing or diaphoretic.  HENT:      Head: Normocephalic and atraumatic.     Right Ear: External ear normal.     Left Ear: External ear normal.     Nose: Nose normal.  Eyes:     General:        Right eye: No discharge.        Left eye: No discharge.  Cardiovascular:     Rate and Rhythm: Normal rate and regular rhythm.     Heart sounds: Normal heart sounds.  Pulmonary:     Effort: Pulmonary effort is normal. No tachypnea or accessory muscle usage.     Breath sounds: Wheezing (diffuse, expiratory) present.  Abdominal:     Palpations: Abdomen is soft.     Tenderness: There is no abdominal tenderness.  Musculoskeletal:     Right lower leg: No edema.     Left lower leg: No edema.  Skin:    General: Skin is warm and dry.  Neurological:     Mental Status: She is alert.  Psychiatric:        Mood and Affect: Mood is not anxious.     ED Results / Procedures / Treatments   Labs (all labs ordered are listed, but only abnormal results are displayed) Labs Reviewed - No data to display  EKG EKG Interpretation  Date/Time:  Wednesday May 10 2021 22:15:35 EDT Ventricular Rate:  85 PR Interval:  136 QRS Duration: 115 QT Interval:  399 QTC Calculation: 475 R Axis:   76 Text Interpretation: Sinus rhythm Right atrial enlargement similar to Nov 2019 Confirmed by Dec 2019 [phone]) on 05/10/2021 10:21:26 PM   Radiology DG Chest Portable 1 View  Result Date: 05/10/2021 CLINICAL DATA:  Cough and shortness of breath. EXAM: PORTABLE CHEST 1 VIEW COMPARISON:  November 07, 2018 FINDINGS: The lungs are hyperinflated. Mild, chronic appearing increased lung markings are seen without evidence of focal consolidation, pleural effusion or pneumothorax. The heart size and mediastinal contours are within normal limits. There is mild calcification of the aortic arch. The visualized skeletal structures are unremarkable. IMPRESSION: COPD without acute or active cardiopulmonary disease. Electronically Signed   By: November 09, 2018 M.D.    On: 05/10/2021 23:11    Procedures Procedures   Medications Ordered in ED Medications  predniSONE (DELTASONE) tablet 40 mg (40 mg Oral Given 05/10/21 2224)  albuterol (VENTOLIN HFA) 108 (90 Base) MCG/ACT inhaler 4 puff (4 puffs Inhalation Given 05/10/21 2223)    ED Course  I have reviewed the triage vital signs and the nursing notes.  Pertinent labs & imaging results that were available during my care of the patient were reviewed by me and considered in my medical decision making (see chart for details).    MDM Rules/Calculators/A&P                          Patient is feeling better after albuterol.  Sounds like she has a mild viral URI setting of COPD versus allergies.  No obvious pneumonia on chest x-ray.  Will treat with steroid burst and she has been given her albuterol inhaler.  We discussed return precautions. Final Clinical Impression(s) / ED Diagnoses Final diagnoses:  COPD exacerbation (HCC)    Rx / DC Orders ED Discharge Orders         Ordered    predniSONE (DELTASONE) 20 MG tablet        05/10/21 2319           Pricilla Loveless, MD 05/10/21 2321

## 2021-05-31 ENCOUNTER — Other Ambulatory Visit: Payer: Self-pay | Admitting: Family Medicine

## 2021-05-31 DIAGNOSIS — Z87891 Personal history of nicotine dependence: Secondary | ICD-10-CM

## 2021-05-31 DIAGNOSIS — Z122 Encounter for screening for malignant neoplasm of respiratory organs: Secondary | ICD-10-CM

## 2021-06-20 ENCOUNTER — Ambulatory Visit: Payer: Medicare Other

## 2021-06-22 ENCOUNTER — Ambulatory Visit
Admission: RE | Admit: 2021-06-22 | Discharge: 2021-06-22 | Disposition: A | Discharge status: Home / Self Care | Payer: Medicare Other | Source: Ambulatory Visit | Attending: Family Medicine | Admitting: Family Medicine

## 2021-06-22 DIAGNOSIS — Z87891 Personal history of nicotine dependence: Secondary | ICD-10-CM

## 2021-06-22 DIAGNOSIS — Z122 Encounter for screening for malignant neoplasm of respiratory organs: Secondary | ICD-10-CM

## 2021-07-01 ENCOUNTER — Other Ambulatory Visit: Payer: Self-pay | Admitting: Cardiology

## 2021-07-01 DIAGNOSIS — I10 Essential (primary) hypertension: Secondary | ICD-10-CM

## 2021-09-24 IMAGING — CT CT CHEST LUNG CANCER SCREENING LOW DOSE W/O CM
2 of 5 series · 15 of 40 positions shown, 18 images · non-contrast
Comparison: 01/27/2019

CLINICAL DATA: Lung cancer screening. Sixty-five pack-year history.
Current asymptomatic smoker.

EXAM:
CT CHEST WITHOUT CONTRAST LOW-DOSE FOR LUNG CANCER SCREENING
TECHNIQUE: Multidetector CT imaging of the chest was performed following the
standard protocol without IV contrast.

[Series 4: lung 1.00 br44 cor · coronal · 0.61mm/px · 3 of 310 slices shown]
[im 62/310  lung]
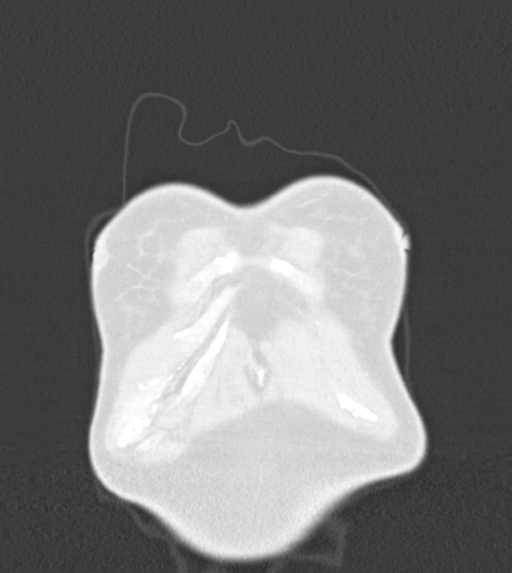
[im 124/310  lung]
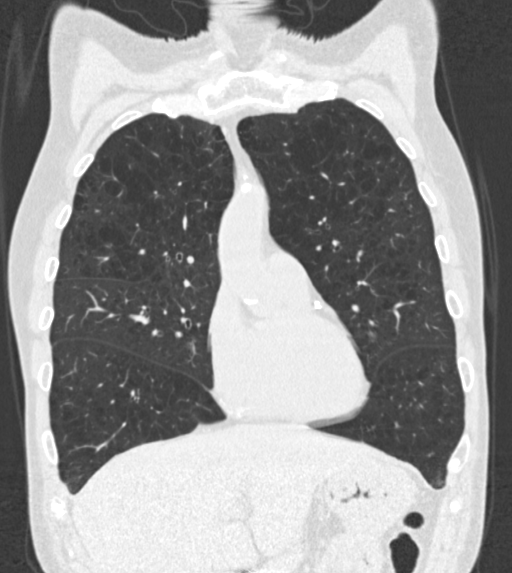
[im 186/310  lung]
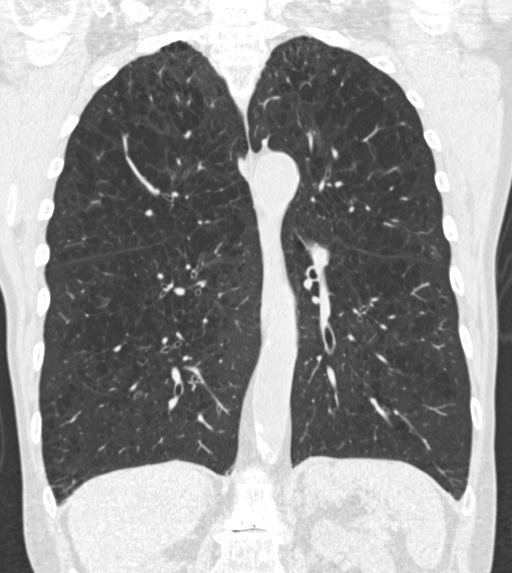

[Series 9: lung 1.00 br60 axial · axial · 0.61mm/px · z∈[-1316,-1003]mm · 12 of 347 slices shown, 15 images]
[im 17/347  mediastinal]
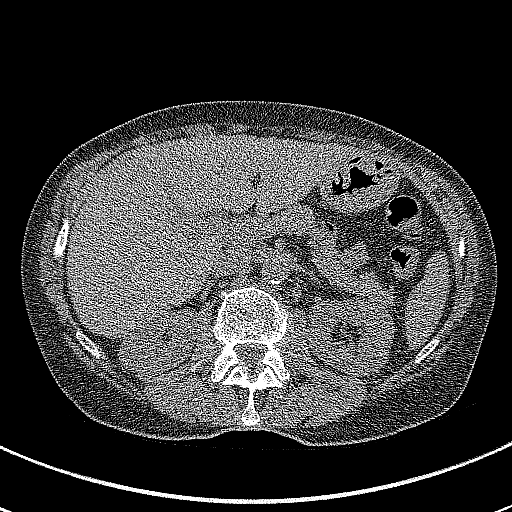
[im 17/347  lung]
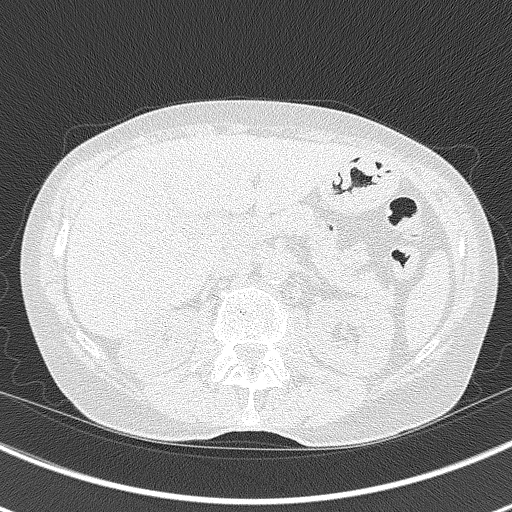
[im 50/347  lung]
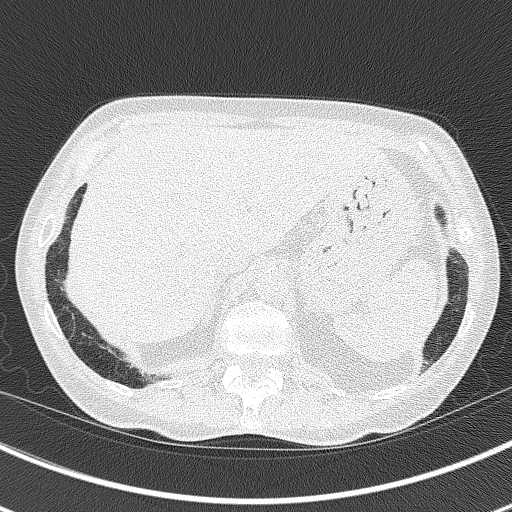
[im 83/347  lung]
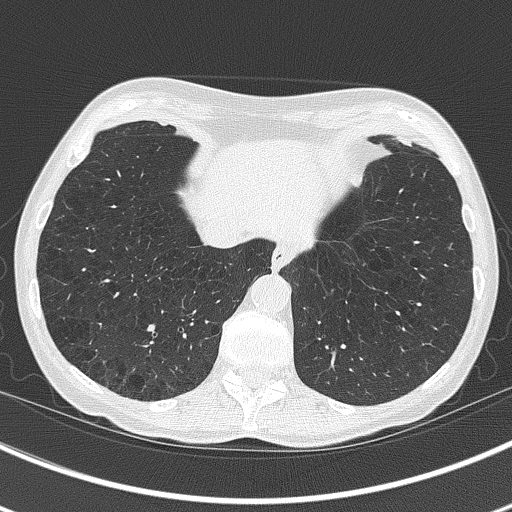
[im 99/347  lung]
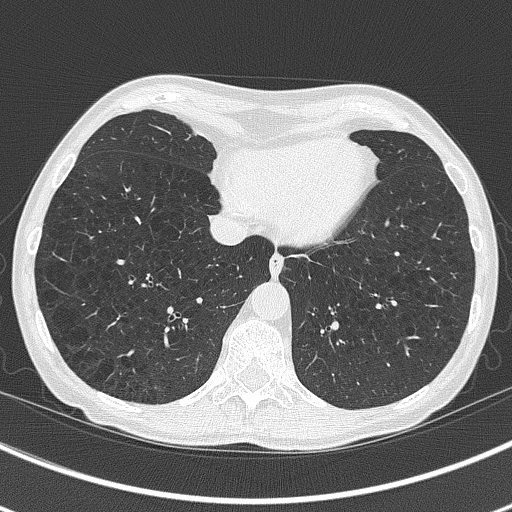
[im 132/347  mediastinal]
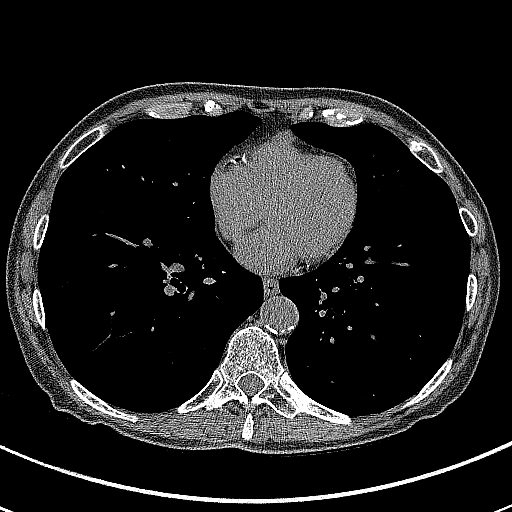
[im 132/347  lung]
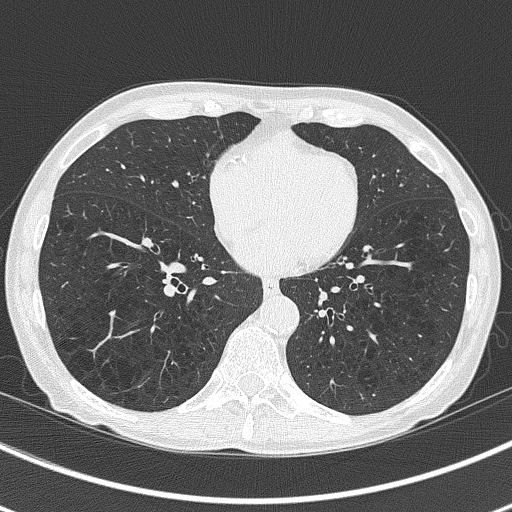
[im 165/347  lung]
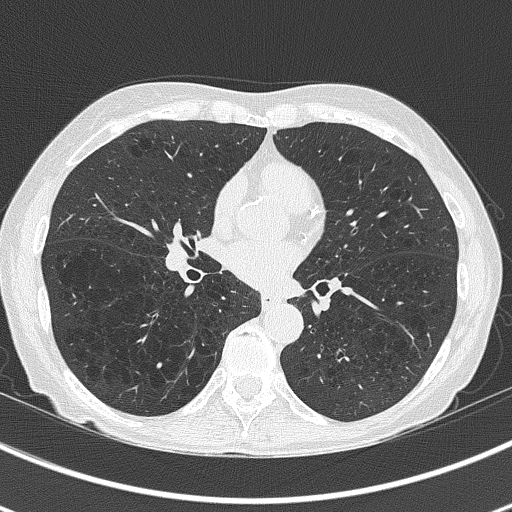
[im 182/347  lung]
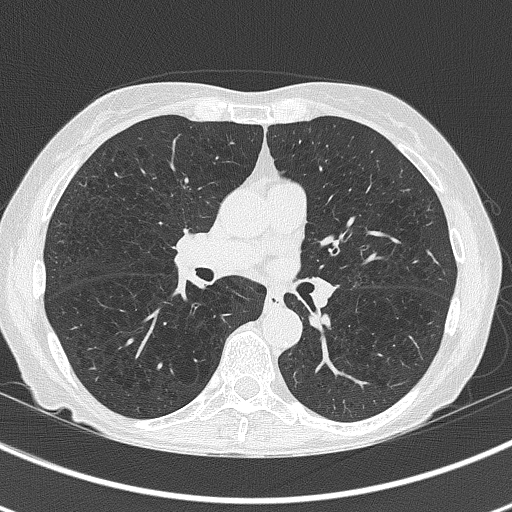
[im 215/347  lung]
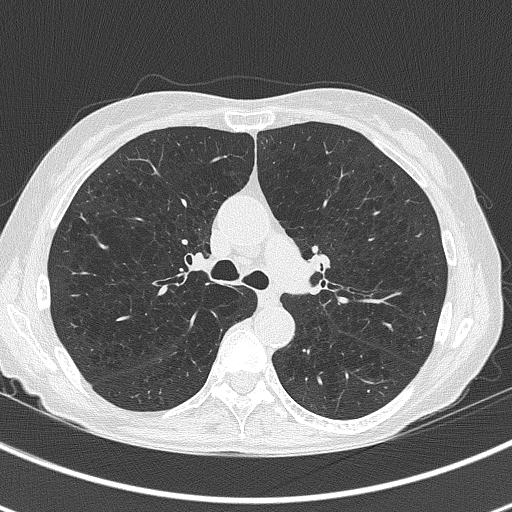
[im 248/347  mediastinal]
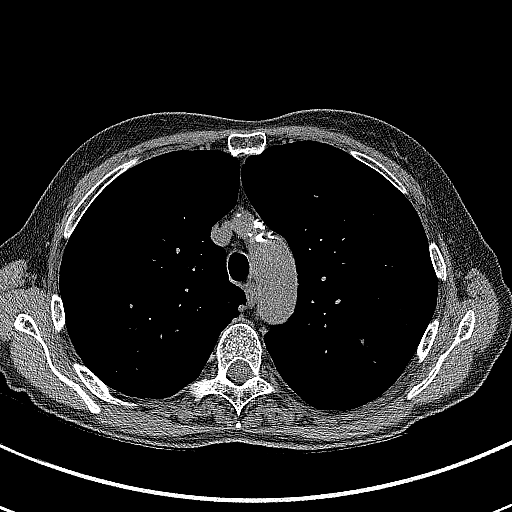
[im 248/347  lung]
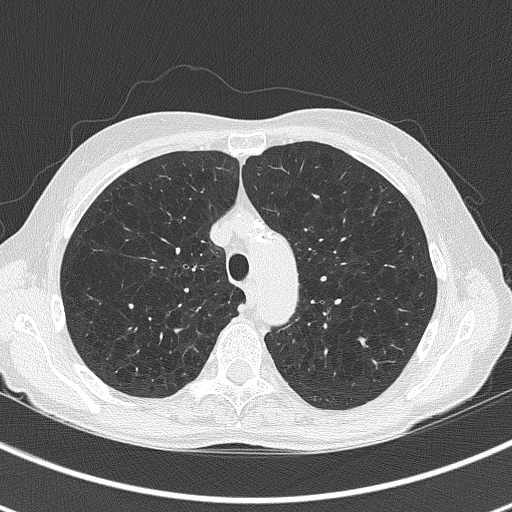
[im 264/347  lung]
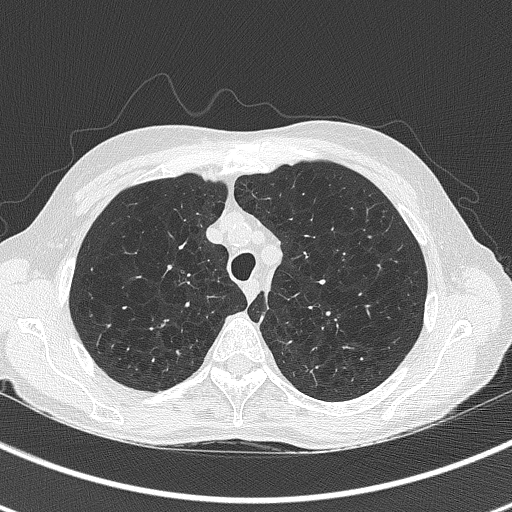
[im 297/347  lung]
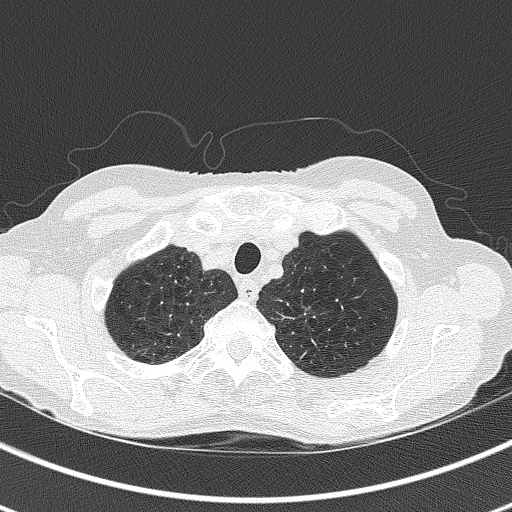
[im 330/347  lung]
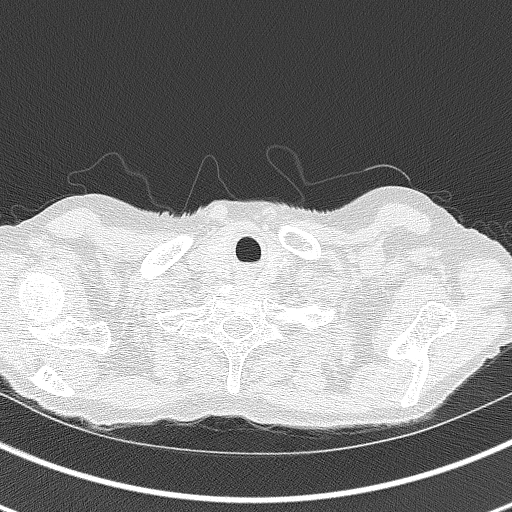

[15 of 40 positions shown; findings below may reference images not displayed]

FINDINGS: Cardiovascular: Heart size is within normal limits. No pericardial
effusion. Aortic atherosclerosis with coronary artery
calcifications.

Mediastinum/Nodes: No enlarged mediastinal, hilar, or axillary lymph
nodes. Thyroid gland, trachea, and esophagus demonstrate no
significant findings.

Lungs/Pleura: Moderate to severe centrilobular emphysema. Diffuse
bronchial wall thickening identified. No pleural effusion, airspace
consolidation or atelectasis. The previously noted calcified and
noncalcified lung nodules are unchanged compared with previous exam.
Within the inferior lingula there is a nodular density with a mean
derived diameter of 7.9 mm, image 261/9. Previously there is a area
of scarring or atelectasis within the inferior lingula.

Upper Abdomen: No acute abnormality. Aortic atherosclerosis.
Previous cholecystectomy.

Musculoskeletal: No chest wall mass or suspicious bone lesions
identified.
IMPRESSION: 1. Lung-RADS 4A, suspicious. Follow up low-dose chest CT without
contrast in 3 months (please use the following order, "CT CHEST LCS
NODULE FOLLOW-UP W/O CM") is recommended. Alternatively, PET may be
considered when there is a solid component 8mm or larger. 7.9 mm,
inferior lingula, image 261/9.
2. Diffuse bronchial wall thickening with emphysema, as above;
imaging findings suggestive of underlying COPD.
3. Coronary artery calcifications.

Aortic Atherosclerosis (5SFAA-3BD.D) and Emphysema (5SFAA-LIQ.G).

## 2021-09-25 ENCOUNTER — Other Ambulatory Visit: Payer: Self-pay | Admitting: Family Medicine

## 2021-09-25 DIAGNOSIS — Z122 Encounter for screening for malignant neoplasm of respiratory organs: Secondary | ICD-10-CM

## 2021-09-25 DIAGNOSIS — Z87891 Personal history of nicotine dependence: Secondary | ICD-10-CM

## 2021-09-25 DIAGNOSIS — R918 Other nonspecific abnormal finding of lung field: Secondary | ICD-10-CM

## 2021-10-20 ENCOUNTER — Ambulatory Visit
Admission: RE | Admit: 2021-10-20 | Discharge: 2021-10-20 | Disposition: A | Discharge status: Home / Self Care | Payer: Medicare Other | Source: Ambulatory Visit | Attending: Family Medicine | Admitting: Family Medicine

## 2021-10-20 DIAGNOSIS — R918 Other nonspecific abnormal finding of lung field: Secondary | ICD-10-CM

## 2021-10-20 DIAGNOSIS — Z122 Encounter for screening for malignant neoplasm of respiratory organs: Secondary | ICD-10-CM

## 2021-10-20 DIAGNOSIS — Z87891 Personal history of nicotine dependence: Secondary | ICD-10-CM

## 2021-11-16 ENCOUNTER — Encounter (HOSPITAL_COMMUNITY): Payer: Self-pay

## 2021-11-16 ENCOUNTER — Other Ambulatory Visit: Payer: Self-pay

## 2021-11-16 ENCOUNTER — Inpatient Hospital Stay (HOSPITAL_COMMUNITY)
Admission: EM | Admit: 2021-11-16 | Discharge: 2021-11-20 | DRG: 193 | Disposition: A | Discharge status: Home / Self Care | Payer: Medicare Other | Attending: Internal Medicine | Admitting: Internal Medicine

## 2021-11-16 ENCOUNTER — Emergency Department (HOSPITAL_COMMUNITY): Payer: Medicare Other

## 2021-11-16 DIAGNOSIS — J101 Influenza due to other identified influenza virus with other respiratory manifestations: Principal | ICD-10-CM | POA: Diagnosis present

## 2021-11-16 DIAGNOSIS — F39 Unspecified mood [affective] disorder: Secondary | ICD-10-CM | POA: Diagnosis present

## 2021-11-16 DIAGNOSIS — I2510 Atherosclerotic heart disease of native coronary artery without angina pectoris: Secondary | ICD-10-CM | POA: Diagnosis present

## 2021-11-16 DIAGNOSIS — Z8249 Family history of ischemic heart disease and other diseases of the circulatory system: Secondary | ICD-10-CM

## 2021-11-16 DIAGNOSIS — Z7951 Long term (current) use of inhaled steroids: Secondary | ICD-10-CM

## 2021-11-16 DIAGNOSIS — J9601 Acute respiratory failure with hypoxia: Secondary | ICD-10-CM | POA: Diagnosis present

## 2021-11-16 DIAGNOSIS — Z20822 Contact with and (suspected) exposure to covid-19: Secondary | ICD-10-CM | POA: Diagnosis present

## 2021-11-16 DIAGNOSIS — E782 Mixed hyperlipidemia: Secondary | ICD-10-CM | POA: Diagnosis present

## 2021-11-16 DIAGNOSIS — Z7952 Long term (current) use of systemic steroids: Secondary | ICD-10-CM

## 2021-11-16 DIAGNOSIS — F17200 Nicotine dependence, unspecified, uncomplicated: Secondary | ICD-10-CM | POA: Diagnosis present

## 2021-11-16 DIAGNOSIS — J440 Chronic obstructive pulmonary disease with acute lower respiratory infection: Secondary | ICD-10-CM | POA: Diagnosis present

## 2021-11-16 DIAGNOSIS — I10 Essential (primary) hypertension: Secondary | ICD-10-CM | POA: Diagnosis present

## 2021-11-16 DIAGNOSIS — F17210 Nicotine dependence, cigarettes, uncomplicated: Secondary | ICD-10-CM | POA: Diagnosis present

## 2021-11-16 DIAGNOSIS — J449 Chronic obstructive pulmonary disease, unspecified: Secondary | ICD-10-CM | POA: Diagnosis present

## 2021-11-16 DIAGNOSIS — Z66 Do not resuscitate: Secondary | ICD-10-CM | POA: Diagnosis present

## 2021-11-16 DIAGNOSIS — Z79899 Other long term (current) drug therapy: Secondary | ICD-10-CM

## 2021-11-16 DIAGNOSIS — J111 Influenza due to unidentified influenza virus with other respiratory manifestations: Secondary | ICD-10-CM

## 2021-11-16 DIAGNOSIS — Z7982 Long term (current) use of aspirin: Secondary | ICD-10-CM

## 2021-11-16 HISTORY — DX: Chronic obstructive pulmonary disease, unspecified: J44.9

## 2021-11-16 LAB — BASIC METABOLIC PANEL
Anion gap: 10 (ref 5–15)
BUN: 16 mg/dL (ref 8–23)
CO2: 27 mmol/L (ref 22–32)
Calcium: 8.8 mg/dL — ABNORMAL LOW (ref 8.9–10.3)
Chloride: 100 mmol/L (ref 98–111)
Creatinine, Ser: 0.8 mg/dL (ref 0.44–1.00)
GFR, Estimated: >60 mL/min (ref >60)
Glucose, Bld: 107 mg/dL — ABNORMAL HIGH (ref 70–99)
Potassium: 3.5 mmol/L (ref 3.5–5.1)
Sodium: 137 mmol/L (ref 135–145)

## 2021-11-16 LAB — CBC WITH DIFFERENTIAL/PLATELET
Abs Immature Granulocytes: 0.03 10*3/uL (ref 0.00–0.07)
Basophils Absolute: 0 10*3/uL (ref 0.0–0.1)
Basophils Relative: 0 %
Eosinophils Absolute: 0 10*3/uL (ref 0.0–0.5)
Eosinophils Relative: 0 %
HCT: 41.8 % (ref 36.0–46.0)
Hemoglobin: 13.8 g/dL (ref 12.0–15.0)
Immature Granulocytes: 0 %
Lymphocytes Relative: 8 %
Lymphs Abs: 0.7 10*3/uL (ref 0.7–4.0)
MCH: 32.1 pg (ref 26.0–34.0)
MCHC: 33 g/dL (ref 30.0–36.0)
MCV: 97.2 fL (ref 80.0–100.0)
Monocytes Absolute: 0.6 10*3/uL (ref 0.1–1.0)
Monocytes Relative: 7 %
Neutro Abs: 7.8 10*3/uL — ABNORMAL HIGH (ref 1.7–7.7)
Neutrophils Relative %: 85 %
Platelets: 177 10*3/uL (ref 150–400)
RBC: 4.3 MIL/uL (ref 3.87–5.11)
RDW: 12.5 % (ref 11.5–15.5)
WBC: 9.2 10*3/uL (ref 4.0–10.5)
nRBC: 0 % (ref 0.0–0.2)

## 2021-11-16 LAB — TROPONIN I (HIGH SENSITIVITY): Troponin I (High Sensitivity): 11 ng/L (ref <18)

## 2021-11-16 LAB — RESP PANEL BY RT-PCR (FLU A&B, COVID) ARPGX2
Influenza A by PCR: POSITIVE — AB
Influenza B by PCR: NEGATIVE
SARS Coronavirus 2 by RT PCR: NEGATIVE

## 2021-11-16 MED ORDER — LOSARTAN POTASSIUM 50 MG PO TABS
50.0000 mg | ORAL_TABLET | Freq: Every morning | ORAL | Status: DC
  Administered 2021-11-17 – 2021-11-20 (×4): 50 mg via ORAL
  Filled 2021-11-16 (×4): qty 1

## 2021-11-16 MED ORDER — FLEET ENEMA 7-19 GM/118ML RE ENEM: 1.0000 | ENEMA | Freq: Once | RECTAL | Status: DC | PRN

## 2021-11-16 MED ORDER — DOCUSATE SODIUM 100 MG PO CAPS
100.0000 mg | ORAL_CAPSULE | Freq: Two times a day (BID) | ORAL | Status: DC
  Administered 2021-11-17 – 2021-11-20 (×7): 100 mg via ORAL
  Filled 2021-11-16 (×8): qty 1

## 2021-11-16 MED ORDER — ENOXAPARIN SODIUM 40 MG/0.4ML IJ SOSY
40.0000 mg | PREFILLED_SYRINGE | INTRAMUSCULAR | Status: DC
  Administered 2021-11-16 – 2021-11-19 (×4): 40 mg via SUBCUTANEOUS
  Filled 2021-11-16 (×4): qty 0.4

## 2021-11-16 MED ORDER — ACETAMINOPHEN 325 MG PO TABS
650.0000 mg | ORAL_TABLET | Freq: Four times a day (QID) | ORAL | Status: DC | PRN
  Administered 2021-11-16: 650 mg via ORAL
  Filled 2021-11-16: qty 2

## 2021-11-16 MED ORDER — ONDANSETRON HCL 4 MG/2ML IJ SOLN: 4.0000 mg | Freq: Four times a day (QID) | INTRAMUSCULAR | Status: DC | PRN

## 2021-11-16 MED ORDER — ESCITALOPRAM OXALATE 10 MG PO TABS
10.0000 mg | ORAL_TABLET | Freq: Every morning | ORAL | Status: DC
  Administered 2021-11-17 – 2021-11-20 (×4): 10 mg via ORAL
  Filled 2021-11-16 (×4): qty 1

## 2021-11-16 MED ORDER — BISACODYL 5 MG PO TBEC: 5.0000 mg | DELAYED_RELEASE_TABLET | Freq: Every day | ORAL | Status: DC | PRN

## 2021-11-16 MED ORDER — BUPROPION HCL ER (SR) 100 MG PO TB12
100.0000 mg | ORAL_TABLET | Freq: Every morning | ORAL | Status: DC
  Administered 2021-11-17 – 2021-11-20 (×4): 100 mg via ORAL
  Filled 2021-11-16 (×4): qty 1

## 2021-11-16 MED ORDER — ALBUTEROL SULFATE (2.5 MG/3ML) 0.083% IN NEBU: 3.0000 mL | INHALATION_SOLUTION | RESPIRATORY_TRACT | Status: DC | PRN

## 2021-11-16 MED ORDER — FAMOTIDINE 20 MG PO TABS
40.0000 mg | ORAL_TABLET | Freq: Every day | ORAL | Status: DC
  Administered 2021-11-17: 40 mg via ORAL
  Filled 2021-11-16: qty 2

## 2021-11-16 MED ORDER — IPRATROPIUM-ALBUTEROL 20-100 MCG/ACT IN AERS
1.0000 | INHALATION_SPRAY | Freq: Four times a day (QID) | RESPIRATORY_TRACT | Status: DC
  Filled 2021-11-16: qty 4

## 2021-11-16 MED ORDER — HYDROCOD POLST-CPM POLST ER 10-8 MG/5ML PO SUER
5.0000 mL | Freq: Two times a day (BID) | ORAL | Status: DC | PRN
  Administered 2021-11-18: 5 mL via ORAL
  Filled 2021-11-16: qty 5

## 2021-11-16 MED ORDER — IPRATROPIUM-ALBUTEROL 0.5-2.5 (3) MG/3ML IN SOLN
3.0000 mL | Freq: Once | RESPIRATORY_TRACT | Status: AC
  Administered 2021-11-16: 3 mL via RESPIRATORY_TRACT
  Filled 2021-11-16: qty 3

## 2021-11-16 MED ORDER — OXYCODONE HCL 5 MG PO TABS: 5.0000 mg | ORAL_TABLET | ORAL | Status: DC | PRN

## 2021-11-16 MED ORDER — ATORVASTATIN CALCIUM 40 MG PO TABS
40.0000 mg | ORAL_TABLET | Freq: Every day | ORAL | Status: DC
  Administered 2021-11-16 – 2021-11-19 (×4): 40 mg via ORAL
  Filled 2021-11-16 (×4): qty 1

## 2021-11-16 MED ORDER — OSELTAMIVIR PHOSPHATE 30 MG PO CAPS
30.0000 mg | ORAL_CAPSULE | Freq: Two times a day (BID) | ORAL | Status: DC
  Administered 2021-11-16 – 2021-11-20 (×8): 30 mg via ORAL
  Filled 2021-11-16 (×10): qty 1

## 2021-11-16 MED ORDER — ONDANSETRON HCL 4 MG PO TABS: 4.0000 mg | ORAL_TABLET | Freq: Four times a day (QID) | ORAL | Status: DC | PRN

## 2021-11-16 MED ORDER — POLYETHYLENE GLYCOL 3350 17 G PO PACK: 17.0000 g | PACK | Freq: Every day | ORAL | Status: DC | PRN

## 2021-11-16 MED ORDER — GUAIFENESIN-DM 100-10 MG/5ML PO SYRP: 10.0000 mL | ORAL_SOLUTION | ORAL | Status: DC | PRN

## 2021-11-16 MED ORDER — METHYLPREDNISOLONE SODIUM SUCC 125 MG IJ SOLR
125.0000 mg | Freq: Once | INTRAMUSCULAR | Status: AC
  Administered 2021-11-16: 125 mg via INTRAVENOUS
  Filled 2021-11-16: qty 2

## 2021-11-16 MED ORDER — OSELTAMIVIR PHOSPHATE 75 MG PO CAPS
75.0000 mg | ORAL_CAPSULE | Freq: Two times a day (BID) | ORAL | Status: DC
  Administered 2021-11-16: 75 mg via ORAL
  Filled 2021-11-16 (×2): qty 1

## 2021-11-16 MED ORDER — ASPIRIN EC 81 MG PO TBEC
81.0000 mg | DELAYED_RELEASE_TABLET | Freq: Every day | ORAL | Status: DC
  Administered 2021-11-17 – 2021-11-20 (×4): 81 mg via ORAL
  Filled 2021-11-16 (×4): qty 1

## 2021-11-16 MED ORDER — IPRATROPIUM-ALBUTEROL 20-100 MCG/ACT IN AERS
1.0000 | INHALATION_SPRAY | Freq: Four times a day (QID) | RESPIRATORY_TRACT | Status: DC | PRN
  Filled 2021-11-16: qty 4

## 2021-11-16 MED ORDER — PANTOPRAZOLE SODIUM 20 MG PO TBEC
20.0000 mg | DELAYED_RELEASE_TABLET | Freq: Every morning | ORAL | Status: DC
  Administered 2021-11-17 – 2021-11-20 (×4): 20 mg via ORAL
  Filled 2021-11-16 (×4): qty 1

## 2021-11-16 MED ORDER — BUDESONIDE 0.5 MG/2ML IN SUSP
0.5000 mg | Freq: Two times a day (BID) | RESPIRATORY_TRACT | Status: DC
  Administered 2021-11-16 – 2021-11-20 (×8): 0.5 mg via RESPIRATORY_TRACT
  Filled 2021-11-16 (×8): qty 2

## 2021-11-16 MED ORDER — AMLODIPINE BESYLATE 10 MG PO TABS
10.0000 mg | ORAL_TABLET | Freq: Every morning | ORAL | Status: DC
  Administered 2021-11-17 – 2021-11-20 (×4): 10 mg via ORAL
  Filled 2021-11-16 (×4): qty 1

## 2021-11-16 NOTE — ED Notes (Signed)
EKG given to Dr. Dykstra  

## 2021-11-16 NOTE — ED Provider Notes (Signed)
[facility] EMERGENCY DEPARTMENT Provider Note   CSN: [id number] Arrival date & time: 11/16/21  1031     History Chief Complaint  Patient presents with   Shortness of Breath    [name] Mccullough is a 70 y.o. female.  Presented to ER with concern for shortness of breath.  Symptoms ongoing since Sunday.  Has had cough, congestion, body aches, feeling generally unwell.  Cough is mostly nonproductive.  Nonbloody.  Shortness of breath worse with exertion, does not wear oxygen at home.  Does have prior history of COPD.  Feels worse than normal flares of her COPD.  Reports her grandson recently tested positive for influenza and she did have a positive exposure.  Did get the flu shot.  HPI     Past Medical History:  Diagnosis Date   COPD (chronic obstructive pulmonary disease) (HCC)    HLD (hyperlipidemia)    Hypertension    Tobacco abuse     Patient Active Problem List   Diagnosis Date Noted   Coronary artery disease involving native coronary artery of native heart without angina pectoris 08/15/2020   Irregular heart beat 07/11/2020   Tobacco dependence 07/11/2020   Mixed hyperlipidemia 07/11/2020   Essential hypertension 07/11/2020   Chest pain 07/11/2020    Past Surgical History:  Procedure Laterality Date   CHOLECYSTECTOMY     RECTAL SURGERY     TONSILLECTOMY       OB History   No obstetric history on file.     Family History  Problem Relation Age of Onset   Heart disease Mother    Cancer Father        unknown  kind    Hypertension Sister    Alcohol abuse Brother    Drug abuse Sister     Social History   Tobacco Use   Smoking status: Every Day    Packs/day: 0.50    Years: 51.00    Pack years: 25.50    Types: Cigarettes   Smokeless tobacco: Never   Tobacco comments:    pack per day 11.21.19  Vaping Use   Vaping Use: Never used  Substance Use Topics   Alcohol use: Yes    Comment: occasional, social    Drug use: Never    Home  Medications Prior to Admission medications   Medication Sig Start Date End Date Taking? Authorizing Provider  amLODipine (NORVASC) 10 MG tablet Take 1 tablet by mouth once daily 07/03/21   Patwardhan, Anabel Bene, MD  aspirin EC 81 MG tablet Take 1 tablet (81 mg total) by mouth daily. Swallow whole. 08/15/20   Patwardhan, Anabel Bene, MD  atorvastatin (LIPITOR) 40 MG tablet Take 1 tablet (40 mg total) by mouth at bedtime. 10/20/20   Patwardhan, Anabel Bene, MD  buPROPion (WELLBUTRIN SR) 100 MG 12 hr tablet Take 1 tablet (100 mg total) by mouth 2 (two) times daily. 10/20/20   Patwardhan, Manish J, MD  escitalopram (LEXAPRO) 10 MG tablet Take 10 mg by mouth daily.  10/28/18   [provider]  losartan (COZAAR) 50 MG tablet Take 1 tablet (50 mg total) by mouth daily. 09/15/20 12/14/20  Patwardhan, Anabel Bene, MD  Multiple Vitamins-Minerals (QC MULTI-VITE 50 & OVER) TABS Take by mouth.    [provider]  predniSONE (DELTASONE) 20 MG tablet 2 tabs po daily x 4 days 05/10/21   Pricilla Loveless, MD  ranitidine (ZANTAC) 75 MG tablet Take 75 mg by mouth 2 (two) times daily.  [provider]  SYMBICORT 80-4.5 MCG/ACT inhaler Inhale 1 puff into the lungs 2 (two) times daily. 06/16/20   [provider]  Vitamin D, Cholecalciferol, 50 MCG (2000 UT) CAPS Take 2,000 Units by mouth daily. 11/13/18   Garner Nash, DO    Allergies    Patient has no known allergies.  Review of Systems   Review of Systems  Constitutional:  Positive for fatigue. Negative for chills and fever.  HENT:  Negative for ear pain and sore throat.   Eyes:  Negative for pain and visual disturbance.  Respiratory:  Positive for cough and shortness of breath.   Cardiovascular:  Negative for chest pain and palpitations.  Gastrointestinal:  Negative for abdominal pain and vomiting.  Genitourinary:  Negative for dysuria and hematuria.  Musculoskeletal:  Positive for arthralgias and myalgias. Negative for back pain.   Skin:  Negative for color change and rash.  Neurological:  Negative for seizures and syncope.  All other systems reviewed and are negative.  Physical Exam Updated Vital Signs BP (!) 114/50   Pulse 88   Temp 98.1 F (36.7 C) (Oral)   Resp (!) 21   Ht 5\' 4"  (1.626 m)   Wt 47.2 kg   SpO2 97%   BMI 17.85 kg/m   Physical Exam Vitals and nursing note reviewed.  Constitutional:      General: She is not in acute distress.    Appearance: She is well-developed.  HENT:     Head: Normocephalic and atraumatic.  Eyes:     Conjunctiva/sclera: Conjunctivae normal.  Cardiovascular:     Rate and Rhythm: Normal rate and regular rhythm.     Heart sounds: No murmur heard. Pulmonary:     Comments: Slight tachypnea but not in distress, speaks in full sentences, mild wheeze noted bilaterally Abdominal:     Palpations: Abdomen is soft.     Tenderness: There is no abdominal tenderness.  Musculoskeletal:        General: No swelling.     Cervical back: Neck supple.  Skin:    General: Skin is warm and dry.     Capillary Refill: Capillary refill takes less than 2 seconds.  Neurological:     Mental Status: She is alert.  Psychiatric:        Mood and Affect: Mood normal.    ED Results / Procedures / Treatments   Labs (all labs ordered are listed, but only abnormal results are displayed) Labs Reviewed  RESP PANEL BY RT-PCR (FLU A&B, COVID) ARPGX2 - Abnormal; Notable for the following components:      Result Value   Influenza A by PCR POSITIVE (*)    All other components within normal limits  CBC WITH DIFFERENTIAL/PLATELET - Abnormal; Notable for the following components:   Neutro Abs 7.8 (*)    All other components within normal limits  BASIC METABOLIC PANEL - Abnormal; Notable for the following components:   Glucose, Bld 107 (*)    Calcium 8.8 (*)    All other components within normal limits  TROPONIN I (HIGH SENSITIVITY)  TROPONIN I (HIGH SENSITIVITY)    EKG EKG  Interpretation  Date/Time:  Thursday November 16 2021 10:47:24 EST Ventricular Rate:  91 PR Interval:  133 QRS Duration: 114 QT Interval:  387 QTC Calculation: 477 R Axis:   74 Text Interpretation: Sinus arrhythmia Consider right atrial enlargement Anterior infarct, old Nonspecific T abnormalities, lateral leads Confirmed by Madalyn Rob [id number]) on 11/16/2021 11:59:38 AM  Radiology DG  Chest Portable 1 View  Result Date: 11/16/2021 CLINICAL DATA:  Shortness of breath EXAM: PORTABLE CHEST 1 VIEW COMPARISON:  Chest x-ray 05/10/2021 FINDINGS: Heart size is normal. Mediastinum appears stable. Calcified plaques in the aortic arch. Lungs are hyperinflated with prominent chronic interstitial lung markings. No focal consolidation visualized. No pleural effusion or pneumothorax. IMPRESSION: Emphysematous changes with no focal consolidation identified. Electronically Signed   By: Ofilia Neas M.D.   On: 11/16/2021 11:42    Procedures .Critical Care Performed by: Lucrezia Starch, MD Authorized by: Lucrezia Starch, MD   Critical care provider statement:    Critical care time (minutes):  34   Critical care was necessary to treat or prevent imminent or life-threatening deterioration of the following conditions:  Respiratory failure   Critical care was time spent personally by me on the following activities:  Development of treatment plan with patient or surrogate, discussions with consultants, evaluation of patient's response to treatment, examination of patient, ordering and review of laboratory studies, ordering and review of radiographic studies, ordering and performing treatments and interventions, pulse oximetry, re-evaluation of patient's condition and review of old charts   Medications Ordered in ED Medications  oseltamivir (TAMIFLU) capsule 75 mg (has no administration in time range)  ipratropium-albuterol (DUONEB) 0.5-2.5 (3) MG/3ML nebulizer solution 3 mL (3 mLs Nebulization  Given 11/16/21 1113)  methylPREDNISolone sodium succinate (SOLU-MEDROL) 125 mg/2 mL injection 125 mg (125 mg Intravenous Given 11/16/21 1113)    ED Course  I have reviewed the triage vital signs and the nursing notes.  Pertinent labs & imaging results that were available during my care of the patient were reviewed by me and considered in my medical decision making (see chart for details).    MDM Rules/Calculators/A&P                          70 year old lady presenting to ER with concern for cough, shortness of breath over the last few days.  On exam not in distress but was mildly hypoxic on room air down to 87%.  Started on supplemental nasal cannula.  Did note mild wheeze on exam.  Gave steroids, DuoNeb, obtain broad work-up.  She is positive for influenza A.  Suspect this is trigger for her presentation today.  Patient became very dyspneic, tachycardic and hypoxic again on room air ambulation trial.  Believe she would benefit from admission.  Discussed with pharmacist, given severity of illness, she would qualify for Tamiflu at this point. Consult to medicine for admit.  Final Clinical Impression(s) / ED Diagnoses Final diagnoses:  Influenza  Acute respiratory failure with hypoxia [facility])    Rx / DC Orders ED Discharge Orders     None        Lucrezia Starch, MD 11/16/21 1250

## 2021-11-16 NOTE — ED Notes (Signed)
O2 at 2LPM initiated for comfort. Pt was very sob without O2. Smokes half a pack of cigarettes daily. Reports worsening sob x 5 days. Uses an inhaler at home with no relief. Denies fever/chills or chest pain. Alert and oriented x 4. Cardiac monitoring in place.

## 2021-11-16 NOTE — H&P (Signed)
History and Physical    ANVIKA Mccullough EZM:[id number] DOB: 1951/05/08 DOA: 11/16/2021  PCP: Margot Ables, MD Consultants:  Rosemary Holms - cardiology; Icard - pulmonology Patient coming from:  Home - lives with youngest son; NOK: Daughter, [name] Mccullough, [phone]   Chief Complaint: SOB  HPI: [name] Mccullough is a 70 y.o. female with medical history significant of COPD not on home O2; HTN; and HLD presenting with SOB.  She was exposed to her grandson, who had influenza.  She was there last Tuesday.  Monday afternoon, she developed a tickle in her throat.  It wasn't too bad at first but it got so bad that she couldn't walk far without feeling like she would pass out.  She took BID breathing treatments.  Today, it didn't feel like it was working anymore.  She tried her rescue inhaler and felt like it only gave her a sore throat.  She is not on home O2.  She still feels SOB despite [place] O2.  She did get the flu shot, as did her grandson.    ED Course: Cough, malaise, SOB.  +flu.  Given steroids, Duoneb, still hypoxic, failed ambulation trial.  Pharmacy suggests Tamiflu.  Review of Systems: As per HPI; otherwise review of systems reviewed and negative.   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  Tobacco Use   Smoking status: Every Day    Packs/day: 1.00    Years: 51.00    Pack years: 51.00    Types:  Cigarettes   Smokeless tobacco: Never   Tobacco comments:    10 cigs/day, less now while sick  Vaping Use   Vaping Use: Never used  Substance and Sexual Activity   Alcohol use: Not Currently    Comment: occasional, social    Drug use: Never   Sexual activity: Not on file  Other Topics Concern   Not on file  Social History Narrative   Not on file   Social Determinants of Health   Financial Resource Strain: Not on file  Food Insecurity: Not on file  Transportation Needs: Not on file  Physical Activity: Not on file  Stress: Not on file  Social Connections: Not on file  Intimate Partner Violence: Not on file    No Known Allergies  Family History  Problem Relation Age of Onset   Heart disease Mother    Cancer Father        unknown  kind    Hypertension Sister    Alcohol abuse Brother    Drug abuse Sister     Prior to Admission medications   Medication Sig Start Date End Date Taking? Authorizing Provider  albuterol (VENTOLIN HFA) 108 (90 Base) MCG/ACT inhaler Inhale 2 puffs into the lungs every 4 (four) hours as needed for wheezing or shortness of breath.  Yes [provider]  amLODipine (NORVASC) 10 MG tablet Take 1 tablet by mouth once daily Patient taking differently: Take 10 mg by mouth every morning. 07/03/21  Yes Patwardhan, Manish J, MD  aspirin EC 81 MG tablet Take 1 tablet (81 mg total) by mouth daily. Swallow whole. 08/15/20  Yes Patwardhan, Anabel Bene, MD  aspirin-acetaminophen-caffeine (EXCEDRIN MIGRAINE) [id number] MG tablet Take 2 tablets by mouth 2 (two) times daily. For headache prevention   Yes [provider]  atorvastatin (LIPITOR) 40 MG tablet Take 1 tablet (40 mg total) by mouth at bedtime. 10/20/20  Yes Patwardhan, Manish J, MD  budesonide (PULMICORT) 0.5 MG/2ML nebulizer solution Take 0.5 mg by nebulization 2 (two) times daily. 10/28/21  Yes [provider]  buPROPion (WELLBUTRIN SR) 100 MG 12 hr tablet Take 1 tablet (100 mg  total) by mouth 2 (two) times daily. Patient taking differently: Take 100 mg by mouth every morning. 10/20/20  Yes Patwardhan, Manish J, MD  Cholecalciferol (VITAMIN D3 PO) Take 1 tablet by mouth every morning.   Yes [provider]  escitalopram (LEXAPRO) 10 MG tablet Take 10 mg by mouth every morning. 10/28/18  Yes [provider]  losartan (COZAAR) 50 MG tablet Take 50 mg by mouth every morning.   Yes [provider]  Multiple Vitamin (MULTIVITAMIN WITH MINERALS) TABS tablet Take 1 tablet by mouth at bedtime. Centrum   Yes [provider]  omeprazole (PRILOSEC OTC) 20 MG tablet Take 20 mg by mouth every morning.   Yes [provider]  oxymetazoline (AFRIN) 0.05 % nasal spray Place 1 spray into both nostrils 2 (two) times daily as needed for congestion.   Yes [provider]  ranitidine (ZANTAC) 150 MG capsule Take 150 mg by mouth 2 (two) times daily.   Yes [provider]    Physical Exam: Vitals:   11/16/21 1100 11/16/21 1145 11/16/21 1253 11/16/21 1345  BP: 115/62 (!) 114/50 123/65 (!) 122/55  Pulse: 93 88 80 78  Resp: 19 (!) 21 (!) 27 (!) 25  Temp:      TempSrc:      SpO2: 97% 97% 99% 98%  Weight:      Height:         General:  Appears calm and comfortable and is in NAD Eyes:  PERRL, EOMI, normal lids, iris ENT:  grossly normal hearing, lips & tongue, mmm Neck:  no LAD, masses or thyromegaly Cardiovascular:  RRR, no m/r/g. No LE edema.  Respiratory:   Diffuse rhonchi with expiratory wheezing.  Normal to mildly increased respiratory effort on [place] O2. Abdomen:  soft, NT, ND, NABS Skin:  no rash or induration seen on limited exam Musculoskeletal:  grossly normal tone BUE/BLE, good ROM, no bony abnormality Psychiatric:  grossly normal mood and affect, speech fluent and appropriate, AOx3 Neurologic:  CN 2-12 grossly intact, moves all extremities in coordinated fashion    Radiological Exams on  Admission: Independently reviewed - see discussion in A/P where applicable  DG Chest Portable 1 View  Result Date: 11/16/2021 CLINICAL DATA:  Shortness of breath EXAM: PORTABLE CHEST 1 VIEW COMPARISON:  Chest x-ray 05/10/2021 FINDINGS: Heart size is normal. Mediastinum appears stable. Calcified plaques in the aortic arch. Lungs are hyperinflated with prominent chronic interstitial lung markings. No focal consolidation visualized. No pleural effusion or pneumothorax. IMPRESSION: Emphysematous changes with no focal consolidation identified. Electronically Signed   By: Jannifer Hick M.D.   On: 11/16/2021 11:42    EKG: Independently reviewed.  Sinus arrhythmia with rate 91; nonspecific ST changes with no evidence of acute ischemia   Labs on Admission: I have personally reviewed the available labs and imaging studies at the time of the admission.  Pertinent labs:   Unremarkable BMP Normal CBC Troponin 11 Influenza A POSITIVE   Assessment/Plan Principal Problem:   Influenza A Active Problems:   Tobacco dependence   Mixed hyperlipidemia   Essential hypertension   COPD (chronic obstructive pulmonary disease) (HCC)   Influenza -Patient with typical flu-like symptoms and known exposure with + test -She is hypoxic and reports no prior h/o O2 requirement -Will observe for now with supportive care, Tamiflu, and O2 -Will monitor on Med Surg with continuous pulse ox, attempt to wean off O2  COPD -Not on home O2 but she may require supplemental O2 at the time of d/c -She is continuing to smoke -Continue Pulmicort, Albuterol -Will add Combivent  HTN -Continue Norvasc -Continue ASA  HLD -Continue Lipitor  Mood d/o -Continue Wellbutrin, Lexapro     Note: This patient has been tested and is negative for the novel coronavirus COVID-19. The patient has been fully vaccinated against COVID-19.   Level of care: Med-Surg DVT prophylaxis:  Lovenox  Code Status:  DNR - confirmed  with patient Family Communication: None present Disposition Plan:  The patient is from: home  Anticipated d/c is to: home without [facility] services   Anticipated d/c date will depend on clinical response to treatment, but possibly as early as tomorrow if she has excellent response to treatment  Patient is currently: acutely ill Consults called: None  Admission status:  It is my clinical opinion that referral for OBSERVATION is reasonable and necessary in this patient based on the above information provided. The aforementioned taken together are felt to place the patient at high risk for further clinical deterioration. However it is anticipated that the patient may be medically stable for discharge from the hospital within 24 to 48 hours.    Jonah Blue MD [facility]   How to contact the [facility] Attending or Consulting provider 7A - 7P or covering provider during after hours 7P -7A, for this patient?  Check the care team in [facility] and look for a) attending/consulting [facility] provider listed and b) the [facility] team listed Log into [email or web address] and use 's universal password to access. If you do not have the password, please contact the hospital operator. Locate the [facility] provider you are looking for under [facility] and page to a number that you can be directly reached. If you still have difficulty reaching the provider, please page the [facility] (Director on Call) for the Hospitalists listed on amion for assistance.   11/16/2021, 2:10 PM

## 2021-11-16 NOTE — ED Triage Notes (Signed)
Hx of COPD and is a current smoker. Here for SOB x 5 days. 88% on RA. Denies fever/chills.

## 2021-11-16 NOTE — ED Notes (Signed)
Ambulated in the hallways near nursing station. O2 dropped 88% on RA as the lowest but HR increased to 136 bpm while ambulating. Pt became very dyspneic and had to take pauses on sentences while talking. MD notified. Will continue to monitor.

## 2021-11-17 DIAGNOSIS — Z7951 Long term (current) use of inhaled steroids: Secondary | ICD-10-CM | POA: Diagnosis not present

## 2021-11-17 DIAGNOSIS — Z66 Do not resuscitate: Secondary | ICD-10-CM | POA: Diagnosis present

## 2021-11-17 DIAGNOSIS — I2510 Atherosclerotic heart disease of native coronary artery without angina pectoris: Secondary | ICD-10-CM | POA: Diagnosis present

## 2021-11-17 DIAGNOSIS — J449 Chronic obstructive pulmonary disease, unspecified: Secondary | ICD-10-CM | POA: Diagnosis not present

## 2021-11-17 DIAGNOSIS — Z20822 Contact with and (suspected) exposure to covid-19: Secondary | ICD-10-CM | POA: Diagnosis present

## 2021-11-17 DIAGNOSIS — Z79899 Other long term (current) drug therapy: Secondary | ICD-10-CM | POA: Diagnosis not present

## 2021-11-17 DIAGNOSIS — F39 Unspecified mood [affective] disorder: Secondary | ICD-10-CM | POA: Diagnosis present

## 2021-11-17 DIAGNOSIS — J9601 Acute respiratory failure with hypoxia: Secondary | ICD-10-CM | POA: Diagnosis present

## 2021-11-17 DIAGNOSIS — J440 Chronic obstructive pulmonary disease with acute lower respiratory infection: Secondary | ICD-10-CM | POA: Diagnosis present

## 2021-11-17 DIAGNOSIS — Z8249 Family history of ischemic heart disease and other diseases of the circulatory system: Secondary | ICD-10-CM | POA: Diagnosis not present

## 2021-11-17 DIAGNOSIS — Z7982 Long term (current) use of aspirin: Secondary | ICD-10-CM | POA: Diagnosis not present

## 2021-11-17 DIAGNOSIS — F17210 Nicotine dependence, cigarettes, uncomplicated: Secondary | ICD-10-CM | POA: Diagnosis present

## 2021-11-17 DIAGNOSIS — Z7952 Long term (current) use of systemic steroids: Secondary | ICD-10-CM | POA: Diagnosis not present

## 2021-11-17 DIAGNOSIS — I10 Essential (primary) hypertension: Secondary | ICD-10-CM | POA: Diagnosis present

## 2021-11-17 DIAGNOSIS — E782 Mixed hyperlipidemia: Secondary | ICD-10-CM | POA: Diagnosis present

## 2021-11-17 DIAGNOSIS — J101 Influenza due to other identified influenza virus with other respiratory manifestations: Secondary | ICD-10-CM | POA: Diagnosis present

## 2021-11-17 MED ORDER — IBUPROFEN 600 MG PO TABS
600.0000 mg | ORAL_TABLET | Freq: Four times a day (QID) | ORAL | Status: DC | PRN
  Administered 2021-11-17 – 2021-11-20 (×6): 600 mg via ORAL
  Filled 2021-11-17 (×6): qty 1

## 2021-11-17 MED ORDER — FAMOTIDINE 20 MG PO TABS
20.0000 mg | ORAL_TABLET | Freq: Every day | ORAL | Status: DC
  Administered 2021-11-18 – 2021-11-20 (×3): 20 mg via ORAL
  Filled 2021-11-17 (×3): qty 1

## 2021-11-17 NOTE — Progress Notes (Signed)
Mobility Specialist Progress Note:   11/17/21 1430  Mobility  Activity Ambulated in hall  Level of Assistance Independent  Assistive Device None  Distance Ambulated (ft) 270 ft  Mobility Ambulated independently in hallway  Mobility Response Tolerated well  Mobility performed by Mobility specialist  $Mobility charge 1 Mobility   Session performed on 2LO2. Pt's SpO2 stayed 88-89% during ambulation. Encouraged IS use, as well as getting OOB.  Addison Lank Mobility Specialist  Phone [phone]

## 2021-11-17 NOTE — Progress Notes (Signed)
PROGRESS NOTE    [name]  ONG:[id number] DOB: [birth year] DOA: 11/16/2021 PCP: Margot Ables, MD     Brief Narrative:  [name] Mccullough is a 70 y.o. female with medical history significant of COPD not on home O2; HTN; and HLD presenting with SOB.  She was exposed to her grandson, who had influenza.  She was there last Tuesday.  Monday afternoon, she developed a tickle in her throat.  It wasn't too bad at first but it got so bad that she couldn't walk far without feeling like she would pass out.  She took BID breathing treatments.  Today, it didn't feel like it was working anymore.  She tried her rescue inhaler and felt like it only gave her a sore throat.  She is not on home O2.  She still feels SOB despite  O2.  She did get the flu shot, as did her grandson. Patient presented to the ED and tested positive for influenza A.   New events last 24 hours / Subjective: Continues to require nasal cannula O2 and having exertional shortness of breath.  Did not sleep much last night due to the bed and also constant interruptions.  Has a headache this morning.  Assessment & Plan:   Principal Problem:   Influenza A Active Problems:   Tobacco dependence   Mixed hyperlipidemia   Essential hypertension   COPD (chronic obstructive pulmonary disease) (HCC)    Influenza A with acute hypoxemic respiratory failure -Oxygen saturation 87% on room air at the time of admission.  Currently on 2 L oxygen and does not use oxygen at baseline.  Continue to wean as able -Continue supportive care and Tamiflu   COPD -Continue breathing treatments and inhalers  CAD -Continue aspirin   HTN -Continue Norvasc, Cozaar   HLD -Continue Lipitor   Mood d/o -Continue Wellbutrin, Lexapro     DVT prophylaxis:  enoxaparin (LOVENOX) injection 40 mg Start: 11/16/21 1600  Code Status:     Code Status Orders  (From admission, onward)           Start     Ordered   11/16/21 1400  Do not  attempt resuscitation (DNR)  Continuous       Question Answer Comment  In the event of cardiac or respiratory ARREST Do not call a "code blue"   In the event of cardiac or respiratory ARREST Do not perform Intubation, CPR, defibrillation or ACLS   In the event of cardiac or respiratory ARREST Use medication by any route, position, wound care, and other measures to relive pain and suffering. May use oxygen, suction and manual treatment of airway obstruction as needed for comfort.      11/16/21 1402           Code Status History     This patient has a current code status but no historical code status.      Family Communication: None at bedside Disposition Plan:  Status is: Observation  The patient will require care spanning > 2 midnights and should be moved to inpatient because: influenza and oxygen requirement.      Consultants:  None  Procedures:  None   Antimicrobials:  Anti-infectives (From admission, onward)    Start     Dose/Rate Route Frequency Ordered Stop   11/16/21 2200  oseltamivir (TAMIFLU) capsule 30 mg        30 mg Oral 2 times daily 11/16/21 1549 11/21/21 2159   11/16/21 1245  oseltamivir (TAMIFLU) capsule  75 mg  Status:  Discontinued        75 mg Oral 2 times daily 11/16/21 1243 11/16/21 1548        Objective: Vitals:   11/17/21 0030 11/17/21 0437 11/17/21 0758 11/17/21 0804  BP:  (!) 145/73 (!) 141/62   Pulse:  85 79   Resp:  18 18   Temp:  99.1 F (37.3 C) 99 F (37.2 C)   TempSrc:  Oral Oral   SpO2: 92% 95% 97% 98%  Weight:      Height:        Intake/Output Summary (Last 24 hours) at 11/17/2021 1049 Last data filed at 11/16/2021 2100 Gross per 24 hour  Intake 360 ml  Output --  Net 360 ml   Filed Weights   11/16/21 1044  Weight: 47.2 kg    Examination:  General exam: Appears calm and comfortable  Respiratory system: Diminished breath sounds bilaterally without wheezing. Respiratory effort normal. No respiratory distress. No  conversational dyspnea.  On nasal cannula O2 Cardiovascular system: S1 & S2 heard, RRR. No murmurs. No pedal edema. Gastrointestinal system: Abdomen is nondistended, soft and nontender. Normal bowel sounds heard. Central nervous system: Alert and oriented. No focal neurological deficits. Speech clear.  Extremities: Symmetric in appearance  Skin: No rashes, lesions or ulcers on exposed skin  Psychiatry: Judgement and insight appear normal. Mood & affect appropriate.   Data Reviewed: I have personally reviewed following labs and imaging studies  CBC: Recent Labs  Lab 11/16/21 1055  WBC 9.2  NEUTROABS 7.8*  HGB 13.8  HCT 41.8  MCV 97.2  PLT 177   Basic Metabolic Panel: Recent Labs  Lab 11/16/21 1055  NA 137  K 3.5  CL 100  CO2 27  GLUCOSE 107*  BUN 16  CREATININE 0.80  CALCIUM 8.8*   GFR: Estimated Creatinine Clearance: 48.1 mL/min (by C-G formula based on SCr of 0.8 mg/dL). Liver Function Tests: No results for input(s): AST, ALT, ALKPHOS, BILITOT, PROT, ALBUMIN in the last 168 hours. No results for input(s): LIPASE, AMYLASE in the last 168 hours. No results for input(s): AMMONIA in the last 168 hours. Coagulation Profile: No results for input(s): INR, PROTIME in the last 168 hours. Cardiac Enzymes: No results for input(s): CKTOTAL, CKMB, CKMBINDEX, TROPONINI in the last 168 hours. BNP (last 3 results) No results for input(s): PROBNP in the last 8760 hours. HbA1C: No results for input(s): HGBA1C in the last 72 hours. CBG: No results for input(s): GLUCAP in the last 168 hours. Lipid Profile: No results for input(s): CHOL, HDL, LDLCALC, TRIG, CHOLHDL, LDLDIRECT in the last 72 hours. Thyroid Function Tests: No results for input(s): TSH, T4TOTAL, FREET4, T3FREE, THYROIDAB in the last 72 hours. Anemia Panel: No results for input(s): VITAMINB12, FOLATE, FERRITIN, TIBC, IRON, RETICCTPCT in the last 72 hours. Sepsis Labs: No results for input(s): PROCALCITON,  LATICACIDVEN in the last 168 hours.  Recent Results (from the past 240 hour(s))  Resp Panel by RT-PCR (Flu A&B, Covid) Nasopharyngeal Swab     Status: Abnormal   Collection Time: 11/16/21 10:57 AM   Specimen: Nasopharyngeal Swab; Nasopharyngeal(NP) swabs in vial transport medium  Result Value Ref Range Status   SARS Coronavirus 2 by RT PCR NEGATIVE NEGATIVE Final    Comment: (NOTE) SARS-CoV-2 target nucleic acids are NOT DETECTED.  The SARS-CoV-2 RNA is generally detectable in upper respiratory specimens during the acute phase of infection. The lowest concentration of SARS-CoV-2 viral copies this assay can detect is 138 copies/mL.  A negative result does not preclude SARS-Cov-2 infection and should not be used as the sole basis for treatment or other patient management decisions. A negative result may occur with  improper specimen collection/handling, submission of specimen other than nasopharyngeal swab, presence of viral mutation(s) within the areas targeted by this assay, and inadequate number of viral copies(<138 copies/mL). A negative result must be combined with clinical observations, patient history, and epidemiological information. The expected result is Negative.  Fact Sheet for Patients:  BloggerCourse.com  Fact Sheet for Healthcare Providers:  SeriousBroker.it  This test is no t yet approved or cleared by the [place] FDA and  has been authorized for detection and/or diagnosis of SARS-CoV-2 by FDA under an Emergency Use Authorization (EUA). This EUA will remain  in effect (meaning this test can be used) for the duration of the COVID-19 declaration under Section 564(b)(1) of the Act, 21 U.S.C.section 360bbb-3(b)(1), unless the authorization is terminated  or revoked sooner.       Influenza A by PCR POSITIVE (A) NEGATIVE Final   Influenza B by PCR NEGATIVE NEGATIVE Final    Comment: (NOTE) The Xpert Xpress  SARS-CoV-2/FLU/RSV plus assay is intended as an aid in the diagnosis of influenza from Nasopharyngeal swab specimens and should not be used as a sole basis for treatment. Nasal washings and aspirates are unacceptable for Xpert Xpress SARS-CoV-2/FLU/RSV testing.  Fact Sheet for Patients: BloggerCourse.com  Fact Sheet for Healthcare Providers: SeriousBroker.it  This test is not yet approved or cleared by the [place] FDA and has been authorized for detection and/or diagnosis of SARS-CoV-2 by FDA under an Emergency Use Authorization (EUA). This EUA will remain in effect (meaning this test can be used) for the duration of the COVID-19 declaration under Section 564(b)(1) of the Act, 21 U.S.C. section 360bbb-3(b)(1), unless the authorization is terminated or revoked.  Performed at [facility], [address] [place], [place] [address]       Radiology Studies: DG Chest Portable 1 View  Result Date: 11/16/2021 CLINICAL DATA:  Shortness of breath EXAM: PORTABLE CHEST 1 VIEW COMPARISON:  Chest x-ray 05/10/2021 FINDINGS: Heart size is normal. Mediastinum appears stable. Calcified plaques in the aortic arch. Lungs are hyperinflated with prominent chronic interstitial lung markings. No focal consolidation visualized. No pleural effusion or pneumothorax. IMPRESSION: Emphysematous changes with no focal consolidation identified. Electronically Signed   By: Jannifer Hick M.D.   On: 11/16/2021 11:42      Scheduled Meds:  amLODipine  10 mg Oral q morning   aspirin EC  81 mg Oral Daily   atorvastatin  40 mg Oral QHS   budesonide  0.5 mg Nebulization BID   buPROPion ER  100 mg Oral q morning   docusate sodium  100 mg Oral BID   enoxaparin (LOVENOX) injection  40 mg Subcutaneous Q24H   escitalopram  10 mg Oral q morning   famotidine  40 mg Oral Daily   losartan  50 mg Oral q morning   oseltamivir  30 mg Oral BID   pantoprazole   20 mg Oral q morning   Continuous Infusions:   LOS: 0 days      Time spent: 25 minutes   Noralee Stain, DO [facility] 11/17/2021, 10:49 AM   Available via Epic secure chat 7am-7pm After these hours, please refer to coverage provider listed on [email or web address]

## 2021-11-17 NOTE — TOC Initial Note (Signed)
Transition of Care [facility]) - Initial/Assessment Note    Patient Details  Name: [name] Mccullough MRN: [id number] Date of Birth: [birth year]  Transition of Care [facility] CM/SW Contact:    Kermit Balo, RN Phone Number: 11/17/2021, 10:32 AM  Clinical Narrative:                 Patient is from home with son that also has the flu. She states he can provide needed supervision.  No DME at home. Pt is responsible for her own medications and denies any issues.  Pt drives self as needed.  TOC following for d/c needs.   Expected Discharge Plan: Home/Self Care Barriers to Discharge: Continued Medical Work up   Patient Goals and CMS Choice        Expected Discharge Plan and Services Expected Discharge Plan: Home/Self Care   Discharge Planning Services: CM Consult   Living arrangements for the past 2 months: Apartment                                      Prior Living Arrangements/Services Living arrangements for the past 2 months: Apartment Lives with:: Adult Children Patient language and need for interpreter reviewed:: Yes Do you feel safe going back to the place where you live?: Yes        Care giver support system in place?: Yes (comment)   Criminal Activity/Legal Involvement Pertinent to Current Situation/Hospitalization: No - Comment as needed  Activities of Daily Living Home Assistive Devices/Equipment: None ADL Screening (condition at time of admission) Patient's cognitive ability adequate to safely complete daily activities?: Yes Is the patient deaf or have difficulty hearing?: No Does the patient have difficulty seeing, even when wearing glasses/contacts?: No Does the patient have difficulty concentrating, remembering, or making decisions?: No Patient able to express need for assistance with ADLs?: Yes Does the patient have difficulty dressing or bathing?: No Independently performs ADLs?: Yes (appropriate for developmental age) Does the patient have difficulty  walking or climbing stairs?: No Weakness of Legs: None Weakness of Arms/Hands: None  Permission Sought/Granted                  Emotional Assessment Appearance:: Appears stated age Attitude/Demeanor/Rapport: Engaged Affect (typically observed): Accepting Orientation: : Oriented to Self, Oriented to Place, Oriented to Situation, Oriented to  Time   Psych Involvement: No (comment)  Admission diagnosis:  Influenza A [J10.1] Acute respiratory failure with hypoxia (HCC) [J96.01] Influenza [J11.1] Patient Active Problem List   Diagnosis Date Noted   Influenza A 11/16/2021   COPD (chronic obstructive pulmonary disease) (HCC) 11/16/2021   Coronary artery disease involving native coronary artery of native heart without angina pectoris 08/15/2020   Irregular heart beat 07/11/2020   Tobacco dependence 07/11/2020   Mixed hyperlipidemia 07/11/2020   Essential hypertension 07/11/2020   Chest pain 07/11/2020   PCP:  Margot Ables, MD Pharmacy:   [facility] [place] - [address] [place]) [place] [address] Phone: [phone] Fax: [phone]     Social Determinants of Health (SDOH) Interventions    Readmission Risk Interventions No flowsheet data found.

## 2021-11-17 NOTE — Care Management Obs Status (Signed)
MEDICARE OBSERVATION STATUS NOTIFICATION   Patient Details  Name: [name] Mccullough MRN: [id number] Date of Birth: 25-Sep-1951   Medicare Observation Status Notification Given:  Yes    Kermit Balo, RN 11/17/2021, 10:32 AM

## 2021-11-18 DIAGNOSIS — J101 Influenza due to other identified influenza virus with other respiratory manifestations: Secondary | ICD-10-CM | POA: Diagnosis not present

## 2021-11-18 NOTE — Progress Notes (Signed)
PROGRESS NOTE    [name] Mccullough  RKY:[id number] DOB: [birth year] DOA: 11/16/2021 PCP: Margot Ables, MD     Brief Narrative:  [name] Mccullough is a 70 y.o. female with medical history significant of COPD not on home O2; HTN; and HLD presenting with SOB.  She was exposed to her grandson, who had influenza.  She was there last Tuesday.  Monday afternoon, she developed a tickle in her throat.  It wasn't too bad at first but it got so bad that she couldn't walk far without feeling like she would pass out.  She took BID breathing treatments.  Today, it didn't feel like it was working anymore.  She tried her rescue inhaler and felt like it only gave her a sore throat.  She is not on home O2.  She still feels SOB despite [place] O2.  She did get the flu shot, as did her grandson. Patient presented to the ED and tested positive for influenza A.   New events last 24 hours / Subjective: States that she has been ambulatory around the unit, but still gets exertional shortness of breath.  Was able to get more rest last night.  Headache well controlled on Motrin.  Does not feel she is ready to go home yet, also her whole family has the flu.  Assessment & Plan:   Principal Problem:   Influenza A Active Problems:   Tobacco dependence   Mixed hyperlipidemia   Essential hypertension   COPD (chronic obstructive pulmonary disease) (HCC)    Influenza A with acute hypoxemic respiratory failure -Oxygen saturation 87% on room air at the time of admission.  Currently on 2 L oxygen and does not use oxygen at baseline.  Continue to wean as able -Continue supportive care and Tamiflu   COPD -Continue breathing treatments and inhalers  CAD -Continue aspirin   HTN -Continue Norvasc, Cozaar   HLD -Continue Lipitor   Mood d/o -Continue Wellbutrin, Lexapro     DVT prophylaxis:  enoxaparin (LOVENOX) injection 40 mg Start: 11/16/21 1600  Code Status:     Code Status Orders  (From admission,  onward)           Start     Ordered   11/16/21 1400  Do not attempt resuscitation (DNR)  Continuous       Question Answer Comment  In the event of cardiac or respiratory ARREST Do not call a "code blue"   In the event of cardiac or respiratory ARREST Do not perform Intubation, CPR, defibrillation or ACLS   In the event of cardiac or respiratory ARREST Use medication by any route, position, wound care, and other measures to relive pain and suffering. May use oxygen, suction and manual treatment of airway obstruction as needed for comfort.      11/16/21 1402           Code Status History     This patient has a current code status but no historical code status.      Family Communication: None at bedside Disposition Plan:  Status is: Inpatient  Remains inpatient appropriate because: O2 requirement and not at baseline. Hopeful dc home 11/27    Consultants:  None  Procedures:  None   Antimicrobials:  Anti-infectives (From admission, onward)    Start     Dose/Rate Route Frequency Ordered Stop   11/16/21 2200  oseltamivir (TAMIFLU) capsule 30 mg        30 mg Oral 2 times daily 11/16/21 1549 11/21/21 2159  11/16/21 1245  oseltamivir (TAMIFLU) capsule 75 mg  Status:  Discontinued        75 mg Oral 2 times daily 11/16/21 1243 11/16/21 1548        Objective: Vitals:   11/17/21 2042 11/18/21 0442 11/18/21 0747 11/18/21 0833  BP: 138/61 140/72 (!) 106/59   Pulse: 80 91 96 96  Resp: 20 16 19 16   Temp: 98 F (36.7 C) 98 F (36.7 C) 98.1 F (36.7 C)   TempSrc:   Oral   SpO2: 97% 98% 91% 94%  Weight:      Height:        Intake/Output Summary (Last 24 hours) at 11/18/2021 1111 Last data filed at 11/18/2021 0759 Gross per 24 hour  Intake 940 ml  Output --  Net 940 ml    Filed Weights   11/16/21 1044  Weight: 47.2 kg    Examination: General exam: Appears calm and comfortable  Respiratory system: Diminished breath sounds bilaterally, no respiratory  distress, no conversational dyspnea Cardiovascular system: S1 & S2 heard, RRR. No pedal edema. Gastrointestinal system: Abdomen is nondistended, soft and nontender. Normal bowel sounds heard. Central nervous system: Alert and oriented. Non focal exam. Speech clear  Extremities: Symmetric in appearance bilaterally  Skin: No rashes, lesions or ulcers on exposed skin  Psychiatry: Judgement and insight appear stable. Mood & affect appropriate.    Data Reviewed: I have personally reviewed following labs and imaging studies  CBC: Recent Labs  Lab 11/16/21 1055  WBC 9.2  NEUTROABS 7.8*  HGB 13.8  HCT 41.8  MCV 97.2  PLT 177    Basic Metabolic Panel: Recent Labs  Lab 11/16/21 1055  NA 137  K 3.5  CL 100  CO2 27  GLUCOSE 107*  BUN 16  CREATININE 0.80  CALCIUM 8.8*    GFR: Estimated Creatinine Clearance: 48.1 mL/min (by C-G formula based on SCr of 0.8 mg/dL). Liver Function Tests: No results for input(s): AST, ALT, ALKPHOS, BILITOT, PROT, ALBUMIN in the last 168 hours. No results for input(s): LIPASE, AMYLASE in the last 168 hours. No results for input(s): AMMONIA in the last 168 hours. Coagulation Profile: No results for input(s): INR, PROTIME in the last 168 hours. Cardiac Enzymes: No results for input(s): CKTOTAL, CKMB, CKMBINDEX, TROPONINI in the last 168 hours. BNP (last 3 results) No results for input(s): PROBNP in the last 8760 hours. HbA1C: No results for input(s): HGBA1C in the last 72 hours. CBG: No results for input(s): GLUCAP in the last 168 hours. Lipid Profile: No results for input(s): CHOL, HDL, LDLCALC, TRIG, CHOLHDL, LDLDIRECT in the last 72 hours. Thyroid Function Tests: No results for input(s): TSH, T4TOTAL, FREET4, T3FREE, THYROIDAB in the last 72 hours. Anemia Panel: No results for input(s): VITAMINB12, FOLATE, FERRITIN, TIBC, IRON, RETICCTPCT in the last 72 hours. Sepsis Labs: No results for input(s): PROCALCITON, LATICACIDVEN in the last 168  hours.  Recent Results (from the past 240 hour(s))  Resp Panel by RT-PCR (Flu A&B, Covid) Nasopharyngeal Swab     Status: Abnormal   Collection Time: 11/16/21 10:57 AM   Specimen: Nasopharyngeal Swab; Nasopharyngeal(NP) swabs in vial transport medium  Result Value Ref Range Status   SARS Coronavirus 2 by RT PCR NEGATIVE NEGATIVE Final    Comment: (NOTE) SARS-CoV-2 target nucleic acids are NOT DETECTED.  The SARS-CoV-2 RNA is generally detectable in upper respiratory specimens during the acute phase of infection. The lowest concentration of SARS-CoV-2 viral copies this assay can detect is 138 copies/mL. A  negative result does not preclude SARS-Cov-2 infection and should not be used as the sole basis for treatment or other patient management decisions. A negative result may occur with  improper specimen collection/handling, submission of specimen other than nasopharyngeal swab, presence of viral mutation(s) within the areas targeted by this assay, and inadequate number of viral copies(<138 copies/mL). A negative result must be combined with clinical observations, patient history, and epidemiological information. The expected result is Negative.  Fact Sheet for Patients:  BloggerCourse.com  Fact Sheet for Healthcare Providers:  SeriousBroker.it  This test is no t yet approved or cleared by the [place] FDA and  has been authorized for detection and/or diagnosis of SARS-CoV-2 by FDA under an Emergency Use Authorization (EUA). This EUA will remain  in effect (meaning this test can be used) for the duration of the COVID-19 declaration under Section 564(b)(1) of the Act, 21 U.S.C.section 360bbb-3(b)(1), unless the authorization is terminated  or revoked sooner.       Influenza A by PCR POSITIVE (A) NEGATIVE Final   Influenza B by PCR NEGATIVE NEGATIVE Final    Comment: (NOTE) The Xpert Xpress SARS-CoV-2/FLU/RSV plus assay is  intended as an aid in the diagnosis of influenza from Nasopharyngeal swab specimens and should not be used as a sole basis for treatment. Nasal washings and aspirates are unacceptable for Xpert Xpress SARS-CoV-2/FLU/RSV testing.  Fact Sheet for Patients: BloggerCourse.com  Fact Sheet for Healthcare Providers: SeriousBroker.it  This test is not yet approved or cleared by the [place] FDA and has been authorized for detection and/or diagnosis of SARS-CoV-2 by FDA under an Emergency Use Authorization (EUA). This EUA will remain in effect (meaning this test can be used) for the duration of the COVID-19 declaration under Section 564(b)(1) of the Act, 21 U.S.C. section 360bbb-3(b)(1), unless the authorization is terminated or revoked.  Performed at [facility], [address], [place], [place] [address]        Radiology Studies: DG Chest Portable 1 View  Result Date: 11/16/2021 CLINICAL DATA:  Shortness of breath EXAM: PORTABLE CHEST 1 VIEW COMPARISON:  Chest x-ray 05/10/2021 FINDINGS: Heart size is normal. Mediastinum appears stable. Calcified plaques in the aortic arch. Lungs are hyperinflated with prominent chronic interstitial lung markings. No focal consolidation visualized. No pleural effusion or pneumothorax. IMPRESSION: Emphysematous changes with no focal consolidation identified. Electronically Signed   By: Jannifer Hick M.D.   On: 11/16/2021 11:42      Scheduled Meds:  amLODipine  10 mg Oral q morning   aspirin EC  81 mg Oral Daily   atorvastatin  40 mg Oral QHS   budesonide  0.5 mg Nebulization BID   buPROPion ER  100 mg Oral q morning   docusate sodium  100 mg Oral BID   enoxaparin (LOVENOX) injection  40 mg Subcutaneous Q24H   escitalopram  10 mg Oral q morning   famotidine  20 mg Oral Daily   losartan  50 mg Oral q morning   oseltamivir  30 mg Oral BID   pantoprazole  20 mg Oral q morning    Continuous Infusions:   LOS: 1 day      Time spent: 25 minutes   Noralee Stain, DO [facility] 11/18/2021, 11:11 AM   Available via Epic secure chat 7am-7pm After these hours, please refer to coverage provider listed on [email or web address]

## 2021-11-19 DIAGNOSIS — I10 Essential (primary) hypertension: Secondary | ICD-10-CM | POA: Diagnosis not present

## 2021-11-19 DIAGNOSIS — E782 Mixed hyperlipidemia: Secondary | ICD-10-CM

## 2021-11-19 DIAGNOSIS — J9601 Acute respiratory failure with hypoxia: Secondary | ICD-10-CM

## 2021-11-19 DIAGNOSIS — J101 Influenza due to other identified influenza virus with other respiratory manifestations: Secondary | ICD-10-CM | POA: Diagnosis not present

## 2021-11-19 LAB — BASIC METABOLIC PANEL
Anion gap: 9 (ref 5–15)
BUN: 10 mg/dL (ref 8–23)
CO2: 32 mmol/L (ref 22–32)
Calcium: 9 mg/dL (ref 8.9–10.3)
Chloride: 97 mmol/L — ABNORMAL LOW (ref 98–111)
Creatinine, Ser: 0.51 mg/dL (ref 0.44–1.00)
GFR, Estimated: >60 mL/min (ref >60)
Glucose, Bld: 114 mg/dL — ABNORMAL HIGH (ref 70–99)
Potassium: 3.3 mmol/L — ABNORMAL LOW (ref 3.5–5.1)
Sodium: 138 mmol/L (ref 135–145)

## 2021-11-19 LAB — BRAIN NATRIURETIC PEPTIDE: B Natriuretic Peptide: 44.7 pg/mL (ref 0.0–100.0)

## 2021-11-19 MED ORDER — UMECLIDINIUM BROMIDE 62.5 MCG/ACT IN AEPB
1.0000 | INHALATION_SPRAY | Freq: Every day | RESPIRATORY_TRACT | Status: DC
  Administered 2021-11-19 – 2021-11-20 (×2): 1 via RESPIRATORY_TRACT
  Filled 2021-11-19: qty 7

## 2021-11-19 MED ORDER — ALBUTEROL SULFATE (2.5 MG/3ML) 0.083% IN NEBU: 3.0000 mL | INHALATION_SOLUTION | RESPIRATORY_TRACT | Status: DC | PRN

## 2021-11-19 MED ORDER — OXYMETAZOLINE HCL 0.05 % NA SOLN
1.0000 | Freq: Two times a day (BID) | NASAL | Status: DC
  Administered 2021-11-19 – 2021-11-20 (×3): 1 via NASAL
  Filled 2021-11-19: qty 30

## 2021-11-19 MED ORDER — GUAIFENESIN ER 600 MG PO TB12
1200.0000 mg | ORAL_TABLET | Freq: Two times a day (BID) | ORAL | Status: DC
  Administered 2021-11-19 – 2021-11-20 (×3): 1200 mg via ORAL
  Filled 2021-11-19 (×3): qty 2

## 2021-11-19 MED ORDER — BENZONATATE 100 MG PO CAPS
200.0000 mg | ORAL_CAPSULE | Freq: Three times a day (TID) | ORAL | Status: DC
  Administered 2021-11-19 – 2021-11-20 (×4): 200 mg via ORAL
  Filled 2021-11-19 (×4): qty 2

## 2021-11-19 MED ORDER — LORATADINE 10 MG PO TABS
10.0000 mg | ORAL_TABLET | Freq: Every day | ORAL | Status: DC
  Administered 2021-11-19 – 2021-11-20 (×2): 10 mg via ORAL
  Filled 2021-11-19 (×2): qty 1

## 2021-11-19 MED ORDER — ARFORMOTEROL TARTRATE 15 MCG/2ML IN NEBU
15.0000 ug | INHALATION_SOLUTION | Freq: Two times a day (BID) | RESPIRATORY_TRACT | Status: DC
  Administered 2021-11-19 – 2021-11-20 (×3): 15 ug via RESPIRATORY_TRACT
  Filled 2021-11-19 (×3): qty 2

## 2021-11-19 MED ORDER — FLUTICASONE PROPIONATE 50 MCG/ACT NA SUSP
1.0000 | Freq: Every day | NASAL | Status: DC
Start: 2021-11-19 — End: 2021-11-20
  Administered 2021-11-19 – 2021-11-20 (×2): 1 via NASAL
  Filled 2021-11-19: qty 16

## 2021-11-19 MED ORDER — POTASSIUM CHLORIDE CRYS ER 20 MEQ PO TBCR
40.0000 meq | EXTENDED_RELEASE_TABLET | Freq: Once | ORAL | Status: AC
  Administered 2021-11-19: 12:00:00 40 meq via ORAL
  Filled 2021-11-19: qty 2

## 2021-11-19 NOTE — Progress Notes (Signed)
PROGRESS NOTE        PATIENT DETAILS Name: [name] Mccullough Age: 70 y.o. Sex: female Date of Birth: [birth year] Admit Date: 11/16/2021 Admitting Physician Jonah Blue, MD [id number], Harlene Ramus, MD  Brief Narrative: Patient is a 70 y.o. female with history of COPD, HTN, HLD-who was exposed to her grandson (sick with influenza)-subsequently developed shortness of breath and cough-found to have acute hypoxemic respiratory failure due to influenza infection.  See below for further details.    Subjective: Continues to have coughing spells-claims that she is short of breath with ambulation-and does not feel she is back to her baseline.  Objective: Vitals: Blood pressure (!) 125/54, pulse 77, temperature 98.4 F (36.9 C), resp. rate 18, height 5\' 4"  (1.626 m), weight 47.2 kg, SpO2 98 %.   Exam: Gen Exam:Alert awake-not in any distress HEENT:atraumatic, normocephalic Chest: Some scattered wheezing. CVS:S1S2 regular Abdomen:soft non tender, non distended Extremities:no edema Neurology: Non focal Skin: no rash  Pertinent Labs/Radiology: Recent Labs  Lab 11/16/21 1055 11/19/21 0816  WBC 9.2  --   HGB 13.8  --   PLT 177  --   NA 137 138  K 3.5 3.3*  CREATININE 0.80 0.51    Assessment/Plan: Acute hypoxemic respiratory failure due to influenza A infection: Continues to complain of mostly exertional dyspnea-on 2 L of oxygen to maintain O2 saturations.  Plan is to continue Tamiflu-bronchodilators-and attempt to titrate off oxygen over the next day or so.  May need home O2 on discharge-have asked RN to perform ambulatory O2 saturation test.   COPD: Some mild wheezing but not in exacerbation-we will switch to Brovana/Incruse-continue budesonide.  Have encouraged use of incentive spirometry-and mobilize as much as possible.  HTN: BP stable-continue Norvasc and Cozaar.  HLD: Continue Lipitor  CAD: No anginal symptoms-continue aspirin.  Mood  disorder: Stable-continue Wellbutrin and Lexapro  BMI Estimated body mass index is 17.85 kg/m as calculated from the following:   Height as of this encounter: 5\' 4"  (1.626 m).   Weight as of this encounter: 47.2 kg.    Procedures: None Consults: None DVT Prophylaxis: Lovenox Code Status:Full code  Family Communication: None at bedside  Time spent: 25-minutes-Greater than 50% of this time was spent in counseling, explanation of diagnosis, planning of further management, and coordination of care.   Disposition Plan: Status is: Inpatient  Remains inpatient appropriate because: Hypoxia due to influenza A infection-not yet stable for discharge.    Diet: Diet Order             Diet regular Room service appropriate? Yes; Fluid consistency: Thin  Diet effective now                     Antimicrobial agents: Anti-infectives (From admission, onward)    Start     Dose/Rate Route Frequency Ordered Stop   11/16/21 2200  oseltamivir (TAMIFLU) capsule 30 mg        30 mg Oral 2 times daily 11/16/21 1549 11/21/21 2159   11/16/21 1245  oseltamivir (TAMIFLU) capsule 75 mg  Status:  Discontinued        75 mg Oral 2 times daily 11/16/21 1243 11/16/21 1548        MEDICATIONS: Scheduled Meds:  amLODipine  10 mg Oral q morning   arformoterol  15 mcg Nebulization BID   aspirin EC  81 mg  Oral Daily   atorvastatin  40 mg Oral QHS   benzonatate  200 mg Oral TID   budesonide  0.5 mg Nebulization BID   buPROPion ER  100 mg Oral q morning   docusate sodium  100 mg Oral BID   enoxaparin (LOVENOX) injection  40 mg Subcutaneous Q24H   escitalopram  10 mg Oral q morning   famotidine  20 mg Oral Daily   fluticasone  1 spray Each Nare Daily   guaiFENesin  1,200 mg Oral BID   loratadine  10 mg Oral Daily   losartan  50 mg Oral q morning   oseltamivir  30 mg Oral BID   oxymetazoline  1 spray Each Nare BID   pantoprazole  20 mg Oral q morning   umeclidinium bromide  1 puff Inhalation  Daily   Continuous Infusions: PRN Meds:.acetaminophen, albuterol, bisacodyl, chlorpheniramine-HYDROcodone, ibuprofen, ondansetron **OR** ondansetron (ZOFRAN) IV, oxyCODONE, polyethylene glycol, sodium phosphate   I have personally reviewed following labs and imaging studies  LABORATORY DATA: CBC: Recent Labs  Lab 11/16/21 1055  WBC 9.2  NEUTROABS 7.8*  HGB 13.8  HCT 41.8  MCV 97.2  PLT 177    Basic Metabolic Panel: Recent Labs  Lab 11/16/21 1055 11/19/21 0816  NA 137 138  K 3.5 3.3*  CL 100 97*  CO2 27 32  GLUCOSE 107* 114*  BUN 16 10  CREATININE 0.80 0.51  CALCIUM 8.8* 9.0    GFR: Estimated Creatinine Clearance: 48.1 mL/min (by C-G formula based on SCr of 0.51 mg/dL).  Liver Function Tests: No results for input(s): AST, ALT, ALKPHOS, BILITOT, PROT, ALBUMIN in the last 168 hours. No results for input(s): LIPASE, AMYLASE in the last 168 hours. No results for input(s): AMMONIA in the last 168 hours.  Coagulation Profile: No results for input(s): INR, PROTIME in the last 168 hours.  Cardiac Enzymes: No results for input(s): CKTOTAL, CKMB, CKMBINDEX, TROPONINI in the last 168 hours.  BNP (last 3 results) No results for input(s): PROBNP in the last 8760 hours.  Lipid Profile: No results for input(s): CHOL, HDL, LDLCALC, TRIG, CHOLHDL, LDLDIRECT in the last 72 hours.  Thyroid Function Tests: No results for input(s): TSH, T4TOTAL, FREET4, T3FREE, THYROIDAB in the last 72 hours.  Anemia Panel: No results for input(s): VITAMINB12, FOLATE, FERRITIN, TIBC, IRON, RETICCTPCT in the last 72 hours.  Urine analysis: No results found for: COLORURINE, APPEARANCEUR, LABSPEC, PHURINE, GLUCOSEU, HGBUR, BILIRUBINUR, KETONESUR, PROTEINUR, UROBILINOGEN, NITRITE, LEUKOCYTESUR  Sepsis Labs: Lactic Acid, Venous No results found for: LATICACIDVEN  MICROBIOLOGY: Recent Results (from the past 240 hour(s))  Resp Panel by RT-PCR (Flu A&B, Covid) Nasopharyngeal Swab     Status:  Abnormal   Collection Time: 11/16/21 10:57 AM   Specimen: Nasopharyngeal Swab; Nasopharyngeal(NP) swabs in vial transport medium  Result Value Ref Range Status   SARS Coronavirus 2 by RT PCR NEGATIVE NEGATIVE Final    Comment: (NOTE) SARS-CoV-2 target nucleic acids are NOT DETECTED.  The SARS-CoV-2 RNA is generally detectable in upper respiratory specimens during the acute phase of infection. The lowest concentration of SARS-CoV-2 viral copies this assay can detect is 138 copies/mL. A negative result does not preclude SARS-Cov-2 infection and should not be used as the sole basis for treatment or other patient management decisions. A negative result may occur with  improper specimen collection/handling, submission of specimen other than nasopharyngeal swab, presence of viral mutation(s) within the areas targeted by this assay, and inadequate number of viral copies(<138 copies/mL). A negative result  must be combined with clinical observations, patient history, and epidemiological information. The expected result is Negative.  Fact Sheet for Patients:  BloggerCourse.com  Fact Sheet for Healthcare Providers:  SeriousBroker.it  This test is no t yet approved or cleared by the [place] FDA and  has been authorized for detection and/or diagnosis of SARS-CoV-2 by FDA under an Emergency Use Authorization (EUA). This EUA will remain  in effect (meaning this test can be used) for the duration of the COVID-19 declaration under Section 564(b)(1) of the Act, 21 U.S.C.section 360bbb-3(b)(1), unless the authorization is terminated  or revoked sooner.       Influenza A by PCR POSITIVE (A) NEGATIVE Final   Influenza B by PCR NEGATIVE NEGATIVE Final    Comment: (NOTE) The Xpert Xpress SARS-CoV-2/FLU/RSV plus assay is intended as an aid in the diagnosis of influenza from Nasopharyngeal swab specimens and should not be used as a sole basis  for treatment. Nasal washings and aspirates are unacceptable for Xpert Xpress SARS-CoV-2/FLU/RSV testing.  Fact Sheet for Patients: BloggerCourse.com  Fact Sheet for Healthcare Providers: SeriousBroker.it  This test is not yet approved or cleared by the [place] FDA and has been authorized for detection and/or diagnosis of SARS-CoV-2 by FDA under an Emergency Use Authorization (EUA). This EUA will remain in effect (meaning this test can be used) for the duration of the COVID-19 declaration under Section 564(b)(1) of the Act, 21 U.S.C. section 360bbb-3(b)(1), unless the authorization is terminated or revoked.  Performed at [facility], [address], [place], [place] [address]     RADIOLOGY STUDIES/RESULTS: No results found.   LOS: 2 days   Jeoffrey Massed, MD  [facility]    To contact the attending provider between 7A-7P or the covering provider during after hours 7P-7A, please log into the web site [email or web address] and access using universal [place] password for that web site. If you do not have the password, please call the hospital operator.  11/19/2021, 10:45 AM

## 2021-11-20 ENCOUNTER — Other Ambulatory Visit (HOSPITAL_COMMUNITY): Payer: Self-pay

## 2021-11-20 DIAGNOSIS — J101 Influenza due to other identified influenza virus with other respiratory manifestations: Secondary | ICD-10-CM | POA: Diagnosis not present

## 2021-11-20 DIAGNOSIS — J449 Chronic obstructive pulmonary disease, unspecified: Secondary | ICD-10-CM | POA: Diagnosis not present

## 2021-11-20 DIAGNOSIS — I10 Essential (primary) hypertension: Secondary | ICD-10-CM | POA: Diagnosis not present

## 2021-11-20 DIAGNOSIS — J9601 Acute respiratory failure with hypoxia: Secondary | ICD-10-CM | POA: Diagnosis not present

## 2021-11-20 MED ORDER — IPRATROPIUM-ALBUTEROL 0.5-2.5 (3) MG/3ML IN SOLN
3.0000 mL | Freq: Four times a day (QID) | RESPIRATORY_TRACT | 1 refills | Status: AC | PRN
  Filled 2021-11-20: qty 360, 30d supply, fill #0

## 2021-11-20 MED ORDER — SPIRIVA HANDIHALER 18 MCG IN CAPS
18.0000 ug | ORAL_CAPSULE | Freq: Every day | RESPIRATORY_TRACT | 2 refills | Status: AC
  Filled 2021-11-20: qty 30, 30d supply, fill #0

## 2021-11-20 MED ORDER — GUAIFENESIN ER 600 MG PO TB12
1200.0000 mg | ORAL_TABLET | Freq: Two times a day (BID) | ORAL | 0 refills | Status: AC
  Filled 2021-11-20: qty 28, 7d supply, fill #0

## 2021-11-20 MED ORDER — BUDESONIDE-FORMOTEROL FUMARATE 160-4.5 MCG/ACT IN AERO
2.0000 | INHALATION_SPRAY | Freq: Two times a day (BID) | RESPIRATORY_TRACT | 2 refills | Status: AC
  Filled 2021-11-20: qty 10.2, 30d supply, fill #0

## 2021-11-20 MED ORDER — OSELTAMIVIR PHOSPHATE 30 MG PO CAPS
30.0000 mg | ORAL_CAPSULE | Freq: Two times a day (BID) | ORAL | 0 refills | Status: AC
  Filled 2021-11-20: qty 2, 1d supply, fill #0

## 2021-11-20 MED ORDER — BENZONATATE 200 MG PO CAPS
200.0000 mg | ORAL_CAPSULE | Freq: Three times a day (TID) | ORAL | 0 refills | Status: AC | PRN
  Filled 2021-11-20: qty 20, 7d supply, fill #0

## 2021-11-20 MED ORDER — ALBUTEROL SULFATE HFA 108 (90 BASE) MCG/ACT IN AERS
2.0000 | INHALATION_SPRAY | Freq: Four times a day (QID) | RESPIRATORY_TRACT | 0 refills | Status: AC | PRN
  Filled 2021-11-20: qty 8.5, 25d supply, fill #0

## 2021-11-20 NOTE — Discharge Summary (Signed)
PATIENT DETAILS Name: [name] Mccullough Age: 70 y.o. Sex: female Date of Birth: 1951/12/22 MRN: [id number]. Admitting Physician: Jonah Blue, MD DPO:[id number], Harlene Ramus, MD  Admit Date: 11/16/2021 Discharge date: 11/20/2021  Recommendations for Outpatient Follow-up:  Follow up with PCP in 1-2 weeks Please obtain CMP/CBC in one week   Admitted From:  Home  Disposition: Home   Home Health: No  Equipment/Devices: None  Discharge Condition: Stable  CODE STATUS: FULL CODE  Diet recommendation:  Diet Order             Diet - low sodium heart healthy           Diet regular Room service appropriate? Yes; Fluid consistency: Thin  Diet effective now                    Brief Summary: Patient is a 70 y.o. female with history of COPD, HTN, HLD-who was exposed to her grandson (sick with influenza)-subsequently developed shortness of breath and cough-found to have acute hypoxemic respiratory failure due to influenza infection.  See below for further details.    Brief Hospital Course: Acute hypoxemic respiratory failure due to influenza A infection: Gradually improved-treated with Tamiflu-bronchodilators and other supportive care.  On discharge-she will continue Tamiflu until she completes a 5-day course.  RN will do a ambulatory O2 sat-if she qualifies-she will be discharged on home O2 as well.   COPD: Not in exacerbation-she will continue on a bronchodilator regimen on discharge.  Note-patient has been unable to afford inhalers due to financial issues-she apparently does not have prescription drug coverage-have asked social work/case management to see if we can help her with medication assistance.   HTN: BP stable-continue Norvasc and Cozaar.  HLD: Continue Lipitor  CAD: No anginal symptoms-continue aspirin.  Mood disorder: Stable-continue Wellbutrin and Lexapro   BMI Estimated body mass index is 17.85 kg/m as calculated from the following:   Height as of  this encounter: 5\' 4"  (1.626 m).   Weight as of this encounter: 47.2 kg.    Procedures None  Discharge Diagnoses:  Principal Problem:   Influenza A Active Problems:   Tobacco dependence   Mixed hyperlipidemia   Essential hypertension   COPD (chronic obstructive pulmonary disease) (HCC)   Discharge Instructions:  Activity:  As tolerated   Discharge Instructions     Call MD for:  difficulty breathing, headache or visual disturbances   Complete by: As directed    Diet - low sodium heart healthy   Complete by: As directed    Discharge instructions   Complete by: As directed    Follow with Primary MD  , MD in 1-2 weeks  Please get a complete blood count and chemistry panel checked by your Primary MD at your next visit, and again as instructed by your Primary MD.  Get Medicines reviewed and adjusted: Please take all your medications with you for your next visit with your Primary MD  Laboratory/radiological data: Please request your Primary MD to go over all hospital tests and procedure/radiological results at the follow up, please ask your Primary MD to get all Hospital records sent to his/her office.  In some cases, they will be blood work, cultures and biopsy results pending at the time of your discharge. Please request that your primary care M.D. follows up on these results.  Also Note the following: If you experience worsening of your admission symptoms, develop shortness of breath, life threatening emergency, suicidal or homicidal thoughts you must  seek medical attention immediately by calling 911 or calling your MD immediately  if symptoms less severe.  You must read complete instructions/literature along with all the possible adverse reactions/side effects for all the Medicines you take and that have been prescribed to you. Take any new Medicines after you have completely understood and accpet all the possible adverse reactions/side effects.   Do  not drive when taking Pain medications or sleeping medications (Benzodaizepines)  Do not take more than prescribed Pain, Sleep and Anxiety Medications. It is not advisable to combine anxiety,sleep and pain medications without talking with your primary care practitioner  Special Instructions: If you have smoked or chewed Tobacco  in the last 2 yrs please stop smoking, stop any regular Alcohol  and or any Recreational drug use.  Wear Seat belts while driving.  Please note: You were cared for by a hospitalist during your hospital stay. Once you are discharged, your primary care physician will handle any further medical issues. Please note that NO REFILLS for any discharge medications will be authorized once you are discharged, as it is imperative that you return to your primary care physician (or establish a relationship with a primary care physician if you do not have one) for your post hospital discharge needs so that they can reassess your need for medications and monitor your lab values.   Increase activity slowly   Complete by: As directed       Allergies as of 11/20/2021   No Known Allergies      Medication List     STOP taking these medications    budesonide 0.5 MG/2ML nebulizer solution Commonly known as: PULMICORT       TAKE these medications    albuterol 108 (90 Base) MCG/ACT inhaler Commonly known as: VENTOLIN HFA Inhale 2 puffs into the lungs every 6 (six) hours as needed for wheezing or shortness of breath. What changed: when to take this   amLODipine 10 MG tablet Commonly known as: NORVASC Take 1 tablet by mouth once daily What changed: when to take this   aspirin EC 81 MG tablet Take 1 tablet (81 mg total) by mouth daily. Swallow whole.   aspirin-acetaminophen-caffeine 250-250-65 MG tablet Commonly known as: EXCEDRIN MIGRAINE Take 2 tablets by mouth 2 (two) times daily. For headache prevention   atorvastatin 40 MG tablet Commonly known as: LIPITOR Take  1 tablet (40 mg total) by mouth at bedtime.   benzonatate 200 MG capsule Commonly known as: TESSALON Take 1 capsule (200 mg total) by mouth 3 (three) times daily as needed for cough.   budesonide-formoterol 160-4.5 MCG/ACT inhaler Commonly known as: Symbicort Inhale 2 puffs into the lungs 2 (two) times daily.   buPROPion ER 100 MG 12 hr tablet Commonly known as: Wellbutrin SR Take 1 tablet (100 mg total) by mouth 2 (two) times daily. What changed: when to take this   escitalopram 10 MG tablet Commonly known as: LEXAPRO Take 10 mg by mouth every morning.   guaiFENesin 600 MG 12 hr tablet Commonly known as: MUCINEX Take 2 tablets (1,200 mg total) by mouth 2 (two) times daily for 7 days.   ipratropium-albuterol 0.5-2.5 (3) MG/3ML Soln Commonly known as: DUONEB Take 3 mLs by nebulization every 6 (six) hours as needed.   losartan 50 MG tablet Commonly known as: COZAAR Take 50 mg by mouth every morning.   multivitamin with minerals Tabs tablet Take 1 tablet by mouth at bedtime. Centrum   omeprazole 20 MG tablet Commonly known  as: PRILOSEC OTC Take 20 mg by mouth every morning.   oseltamivir 30 MG capsule Commonly known as: TAMIFLU Take 1 capsule (30 mg total) by mouth 2 (two) times daily.   oxymetazoline 0.05 % nasal spray Commonly known as: AFRIN Place 1 spray into both nostrils 2 (two) times daily as needed for congestion.   ranitidine 150 MG capsule Commonly known as: ZANTAC Take 150 mg by mouth 2 (two) times daily.   Spiriva HandiHaler 18 MCG inhalation capsule Generic drug: tiotropium Place 1 capsule (18 mcg total) into inhaler and inhale daily.   VITAMIN D3 PO Take 1 tablet by mouth every morning.        Follow-up Information     Margot Ables, MD. Schedule an appointment as soon as possible for a visit in 1 week(s).   Specialty: Family Medicine Contact information: [address] [place] [address] [phone]                 No Known Allergies    Consultations:  None   Other Procedures/Studies: DG Chest Portable 1 View  Result Date: 11/16/2021 CLINICAL DATA:  Shortness of breath EXAM: PORTABLE CHEST 1 VIEW COMPARISON:  Chest x-ray 05/10/2021 FINDINGS: Heart size is normal. Mediastinum appears stable. Calcified plaques in the aortic arch. Lungs are hyperinflated with prominent chronic interstitial lung markings. No focal consolidation visualized. No pleural effusion or pneumothorax. IMPRESSION: Emphysematous changes with no focal consolidation identified. Electronically Signed   By: Jannifer Hick M.D.   On: 11/16/2021 11:42     TODAY-DAY OF DISCHARGE:  Subjective:   [name] Mccullough today has no headache,no chest abdominal pain,no new weakness tingling or numbness, feels much better wants to go home today.   Objective:   Blood pressure (!) 151/73, pulse (!) 101, temperature 98.7 F (37.1 C), temperature source Oral, resp. rate 17, height 5\' 4"  (1.626 m), weight 47.2 kg, SpO2 92 %.  Intake/Output Summary (Last 24 hours) at 11/20/2021 1015 Last data filed at 11/20/2021 0555 Gross per 24 hour  Intake 1240 ml  Output --  Net 1240 ml   Filed Weights   11/16/21 1044  Weight: 47.2 kg    Exam: Awake Alert, Oriented *3, No new F.N deficits, Normal affect [place].AT,PERRAL Supple Neck,No JVD, No cervical lymphadenopathy appriciated.  Symmetrical Chest wall movement, Good air movement bilaterally, CTAB RRR,No Gallops,Rubs or new Murmurs, No Parasternal Heave +ve B.Sounds, Abd Soft, Non tender, No organomegaly appriciated, No rebound -guarding or rigidity. No Cyanosis, Clubbing or edema, No new Rash or bruise   PERTINENT RADIOLOGIC STUDIES: No results found.   PERTINENT LAB RESULTS: CBC: No results for input(s): WBC, HGB, HCT, PLT in the last 72 hours. CMET CMP     Component Value Date/Time   NA 138 11/19/2021 0816   K 3.3 (L) 11/19/2021 0816   CL 97 (L) 11/19/2021 0816   CO2 32  11/19/2021 0816   GLUCOSE 114 (H) 11/19/2021 0816   BUN 10 11/19/2021 0816   CREATININE 0.51 11/19/2021 0816   CALCIUM 9.0 11/19/2021 0816   PROT 6.0 08/15/2009 0520   ALBUMIN 2.9 (L) 08/15/2009 0520   AST 72 (H) 08/15/2009 0520   ALT 106 (H) 08/15/2009 0520   ALKPHOS 305 (H) 08/15/2009 0520   BILITOT 0.9 08/15/2009 0520   GFRNONAA >60 11/19/2021 0816   GFRAA >60 11/07/2018 0627    GFR Estimated Creatinine Clearance: 48.1 mL/min (by C-G formula based on SCr of 0.51 mg/dL). No results for input(s): LIPASE, AMYLASE in the  last 72 hours. No results for input(s): CKTOTAL, CKMB, CKMBINDEX, TROPONINI in the last 72 hours. Invalid input(s): POCBNP No results for input(s): DDIMER in the last 72 hours. No results for input(s): HGBA1C in the last 72 hours. No results for input(s): CHOL, HDL, LDLCALC, TRIG, CHOLHDL, LDLDIRECT in the last 72 hours. No results for input(s): TSH, T4TOTAL, T3FREE, THYROIDAB in the last 72 hours.  Invalid input(s): FREET3 No results for input(s): VITAMINB12, FOLATE, FERRITIN, TIBC, IRON, RETICCTPCT in the last 72 hours. Coags: No results for input(s): INR in the last 72 hours.  Invalid input(s): PT Microbiology: Recent Results (from the past 240 hour(s))  Resp Panel by RT-PCR (Flu A&B, Covid) Nasopharyngeal Swab     Status: Abnormal   Collection Time: 11/16/21 10:57 AM   Specimen: Nasopharyngeal Swab; Nasopharyngeal(NP) swabs in vial transport medium  Result Value Ref Range Status   SARS Coronavirus 2 by RT PCR NEGATIVE NEGATIVE Final    Comment: (NOTE) SARS-CoV-2 target nucleic acids are NOT DETECTED.  The SARS-CoV-2 RNA is generally detectable in upper respiratory specimens during the acute phase of infection. The lowest concentration of SARS-CoV-2 viral copies this assay can detect is 138 copies/mL. A negative result does not preclude SARS-Cov-2 infection and should not be used as the sole basis for treatment or other patient management decisions. A  negative result may occur with  improper specimen collection/handling, submission of specimen other than nasopharyngeal swab, presence of viral mutation(s) within the areas targeted by this assay, and inadequate number of viral copies(<138 copies/mL). A negative result must be combined with clinical observations, patient history, and epidemiological information. The expected result is Negative.  Fact Sheet for Patients:  BloggerCourse.com  Fact Sheet for Healthcare Providers:  SeriousBroker.it  This test is no t yet approved or cleared by the [place] FDA and  has been authorized for detection and/or diagnosis of SARS-CoV-2 by FDA under an Emergency Use Authorization (EUA). This EUA will remain  in effect (meaning this test can be used) for the duration of the COVID-19 declaration under Section 564(b)(1) of the Act, 21 U.S.C.section 360bbb-3(b)(1), unless the authorization is terminated  or revoked sooner.       Influenza A by PCR POSITIVE (A) NEGATIVE Final   Influenza B by PCR NEGATIVE NEGATIVE Final    Comment: (NOTE) The Xpert Xpress SARS-CoV-2/FLU/RSV plus assay is intended as an aid in the diagnosis of influenza from Nasopharyngeal swab specimens and should not be used as a sole basis for treatment. Nasal washings and aspirates are unacceptable for Xpert Xpress SARS-CoV-2/FLU/RSV testing.  Fact Sheet for Patients: BloggerCourse.com  Fact Sheet for Healthcare Providers: SeriousBroker.it  This test is not yet approved or cleared by the [place] FDA and has been authorized for detection and/or diagnosis of SARS-CoV-2 by FDA under an Emergency Use Authorization (EUA). This EUA will remain in effect (meaning this test can be used) for the duration of the COVID-19 declaration under Section 564(b)(1) of the Act, 21 U.S.C. section 360bbb-3(b)(1), unless the  authorization is terminated or revoked.  Performed at [facility], [address] [place], [place] [address]     FURTHER DISCHARGE INSTRUCTIONS:  Get Medicines reviewed and adjusted: Please take all your medications with you for your next visit with your Primary MD  Laboratory/radiological data: Please request your Primary MD to go over all hospital tests and procedure/radiological results at the follow up, please ask your Primary MD to get all Hospital records sent to his/her office.  In  some cases, they will be blood work, cultures and biopsy results pending at the time of your discharge. Please request that your primary care M.D. goes through all the records of your hospital data and follows up on these results.  Also Note the following: If you experience worsening of your admission symptoms, develop shortness of breath, life threatening emergency, suicidal or homicidal thoughts you must seek medical attention immediately by calling 911 or calling your MD immediately  if symptoms less severe.  You must read complete instructions/literature along with all the possible adverse reactions/side effects for all the Medicines you take and that have been prescribed to you. Take any new Medicines after you have completely understood and accpet all the possible adverse reactions/side effects.   Do not drive when taking Pain medications or sleeping medications (Benzodaizepines)  Do not take more than prescribed Pain, Sleep and Anxiety Medications. It is not advisable to combine anxiety,sleep and pain medications without talking with your primary care practitioner  Special Instructions: If you have smoked or chewed Tobacco  in the last 2 yrs please stop smoking, stop any regular Alcohol  and or any Recreational drug use.  Wear Seat belts while driving.  Please note: You were cared for by a hospitalist during your hospital stay. Once you are discharged, your primary care physician will  handle any further medical issues. Please note that NO REFILLS for any discharge medications will be authorized once you are discharged, as it is imperative that you return to your primary care physician (or establish a relationship with a primary care physician if you do not have one) for your post hospital discharge needs so that they can reassess your need for medications and monitor your lab values.  Total Time spent coordinating discharge including counseling, education and face to face time equals 35 minutes.  SignedJeoffrey Massed 11/20/2021 10:15 AM

## 2021-11-20 NOTE — TOC Transition Note (Signed)
Transition of Care [facility]) - CM/SW Discharge Note   Patient Details  Name: TAESHA GOODELL MRN: [id number] Date of Birth: 1951/10/15  [facility] CM/SW Contact:  Kermit Balo, RN Phone Number: 11/20/2021, 11:41 AM   Clinical Narrative:    Patient is discharging home with self care.  Pt qualified for home oxygen. She had no preference on DME company for the oxygen. [employer] will deliver a tank to the room for transport home and set up oxygen in her home.  Pt is without prescription drug coverage. CM has encouraged her to look into adding this to her medicare. CM has provided information on the AVS. Pt to privately pay for meds today and they will be delivered to the room per [facility] pharmacy.  Pt has transportation home.    Final next level of care: Home/Self Care Barriers to Discharge: No Barriers Identified   Patient Goals and CMS Choice     Choice offered to / list presented to : Patient  Discharge Placement                       Discharge Plan and Services   Discharge Planning Services: CM Consult            DME Arranged: Oxygen DME Agency: [employer] Date DME Agency Contacted: 11/20/21   Representative spoke with at DME Agency: Jasmin            Social Determinants of Health (SDOH) Interventions     Readmission Risk Interventions No flowsheet data found.

## 2021-11-20 NOTE — Progress Notes (Signed)
SATURATION QUALIFICATIONS: (This note is used to comply with regulatory documentation for home oxygen)  Patient Saturations on Room Air at Rest = 78%  Patient Saturations on Room Air while Ambulating = 82%  Patient Saturations on 2 Liters of oxygen while Ambulating = 92%  Please briefly explain why patient needs home oxygen:

## 2022-05-01 ENCOUNTER — Other Ambulatory Visit: Payer: Self-pay | Admitting: *Deleted

## 2022-05-01 DIAGNOSIS — Z87891 Personal history of nicotine dependence: Secondary | ICD-10-CM

## 2022-05-01 DIAGNOSIS — F17210 Nicotine dependence, cigarettes, uncomplicated: Secondary | ICD-10-CM

## 2022-05-01 DIAGNOSIS — Z122 Encounter for screening for malignant neoplasm of respiratory organs: Secondary | ICD-10-CM

## 2022-05-01 NOTE — Progress Notes (Signed)
Chest  

## 2022-08-31 ENCOUNTER — Emergency Department (HOSPITAL_BASED_OUTPATIENT_CLINIC_OR_DEPARTMENT_OTHER)
Admission: EM | Admit: 2022-08-31 | Discharge: 2022-08-31 | Disposition: A | Discharge status: Home / Self Care | Payer: Medicare Other | Attending: Emergency Medicine | Admitting: Emergency Medicine

## 2022-08-31 ENCOUNTER — Other Ambulatory Visit: Payer: Self-pay

## 2022-08-31 ENCOUNTER — Encounter (HOSPITAL_BASED_OUTPATIENT_CLINIC_OR_DEPARTMENT_OTHER): Payer: Self-pay | Admitting: Emergency Medicine

## 2022-08-31 ENCOUNTER — Emergency Department (HOSPITAL_BASED_OUTPATIENT_CLINIC_OR_DEPARTMENT_OTHER): Payer: Medicare Other

## 2022-08-31 DIAGNOSIS — S9032XA Contusion of left foot, initial encounter: Secondary | ICD-10-CM | POA: Insufficient documentation

## 2022-08-31 DIAGNOSIS — Z7982 Long term (current) use of aspirin: Secondary | ICD-10-CM | POA: Diagnosis not present

## 2022-08-31 DIAGNOSIS — W07XXXA Fall from chair, initial encounter: Secondary | ICD-10-CM | POA: Insufficient documentation

## 2022-08-31 DIAGNOSIS — S99922A Unspecified injury of left foot, initial encounter: Secondary | ICD-10-CM | POA: Diagnosis present

## 2022-08-31 NOTE — Discharge Instructions (Signed)
You are seen in the emergency department for right heel pain after a fall.  Your x-rays did not show any obvious fracture or dislocation.  Please elevate your foot and ice to the affected area.  Weightbearing as tolerated.  Follow-up with your regular doctor and return to the emergency department if any worsening or concerning symptoms.

## 2022-08-31 NOTE — ED Provider Notes (Signed)
[facility] EMERGENCY DEPT Provider Note   CSN: [id number] Arrival date & time: 08/31/22  1751     History  No chief complaint on file.   [name] Mccullough is a 71 y.o. female.  She is here with a complaint of left heel pain after a fall.  She said 4 days ago she was standing on a chair trying to get a bug off the ceiling when she fell landing on her left foot.  Since then she has had left heel pain.  Denies ankle pain.  No numbness or weakness.  She is having some swelling.  She denies any other injuries.  The history is provided by the patient.  Foot Injury Location:  Foot Time since incident:  4 days Injury: yes   Mechanism of injury: fall   Foot location:  L foot Pain details:    Radiates to:  Does not radiate   Progression:  Unchanged Chronicity:  New Dislocation: no   Relieved by:  Nothing Worsened by:  Bearing weight Ineffective treatments:  Rest Associated symptoms: swelling   Associated symptoms: no back pain, no fever and no neck pain        Home Medications Prior to Admission medications   Medication Sig Start Date End Date Taking? Authorizing Provider  albuterol (VENTOLIN HFA) 108 (90 Base) MCG/ACT inhaler Inhale 2 puffs into the lungs every 6 (six) hours as needed for wheezing or shortness of breath. 11/20/21   Ghimire, Werner Lean, MD  amLODipine (NORVASC) 10 MG tablet Take 1 tablet by mouth once daily Patient taking differently: Take 10 mg by mouth every morning. 07/03/21   Patwardhan, Anabel Bene, MD  aspirin EC 81 MG tablet Take 1 tablet (81 mg total) by mouth daily. Swallow whole. 08/15/20   Patwardhan, Anabel Bene, MD  aspirin-acetaminophen-caffeine (EXCEDRIN MIGRAINE) [phone] MG tablet Take 2 tablets by mouth 2 (two) times daily. For headache prevention    [provider]  atorvastatin (LIPITOR) 40 MG tablet Take 1 tablet (40 mg total) by mouth at bedtime. 10/20/20   Patwardhan, Anabel Bene, MD  benzonatate (TESSALON) 200 MG capsule Take 1  capsule (200 mg total) by mouth 3 (three) times daily as needed for cough. 11/20/21   Ghimire, Werner Lean, MD  budesonide-formoterol (SYMBICORT) 160-4.5 MCG/ACT inhaler Inhale 2 puffs into the lungs 2 (two) times daily. 11/20/21   Ghimire, Werner Lean, MD  buPROPion (WELLBUTRIN SR) 100 MG 12 hr tablet Take 1 tablet (100 mg total) by mouth 2 (two) times daily. Patient taking differently: Take 100 mg by mouth every morning. 10/20/20   Patwardhan, Anabel Bene, MD  Cholecalciferol (VITAMIN D3 PO) Take 1 tablet by mouth every morning.    [provider]  escitalopram (LEXAPRO) 10 MG tablet Take 10 mg by mouth every morning. 10/28/18   [provider]  ipratropium-albuterol (DUONEB) 0.5-2.5 (3) MG/3ML SOLN use 1 vial by nebulization every 6 (six) hours as needed. 11/20/21   Ghimire, Werner Lean, MD  losartan (COZAAR) 50 MG tablet Take 50 mg by mouth every morning.    [provider]  Multiple Vitamin (MULTIVITAMIN WITH MINERALS) TABS tablet Take 1 tablet by mouth at bedtime. Centrum    [provider]  omeprazole (PRILOSEC OTC) 20 MG tablet Take 20 mg by mouth every morning.    [provider]  oseltamivir (TAMIFLU) 30 MG capsule Take 1 capsule (30 mg total) by mouth 2 (two) times daily. 11/20/21   Ghimire, Werner Lean, MD  oxymetazoline (AFRIN) 0.05 %  nasal spray Place 1 spray into both nostrils 2 (two) times daily as needed for congestion.    [provider]  ranitidine (ZANTAC) 150 MG capsule Take 150 mg by mouth 2 (two) times daily.    [provider]  tiotropium (SPIRIVA HANDIHALER) 18 MCG inhalation capsule Place 1 capsule (18 mcg total) into inhaler and inhale daily. 11/20/21 02/18/22  GhimireWerner Lean, MD      Allergies    Patient has no known allergies.    Review of Systems   Review of Systems  Constitutional:  Negative for fever.  Musculoskeletal:  Negative for back pain and neck pain.    Physical Exam Updated Vital Signs BP (!)  172/72 (BP Location: Right Arm)   Pulse 78   Temp 98.2 F (36.8 C) (Oral)   Resp 14   SpO2 100%  Physical Exam Constitutional:      Appearance: Normal appearance. She is well-developed.  HENT:     Head: Normocephalic and atraumatic.  Eyes:     Conjunctiva/sclera: Conjunctivae normal.  Musculoskeletal:        General: Swelling and tenderness present. No deformity. Normal range of motion.     Cervical back: Neck supple.     Comments: Left knee and ankle nontender.  Left midfoot and toes nontender.  She has moderate tenderness of her left heel.  Minimal swelling.  No bruising.  Distal pulses motor and sensation intact.  Skin:    General: Skin is warm and dry.  Neurological:     General: No focal deficit present.     Mental Status: She is alert.     GCS: GCS eye subscore is 4. GCS verbal subscore is 5. GCS motor subscore is 6.     Sensory: No sensory deficit.     Motor: No weakness.     ED Results / Procedures / Treatments   Labs (all labs ordered are listed, but only abnormal results are displayed) Labs Reviewed - No data to display  EKG None  Radiology DG Os Calcis Left  Result Date: 08/31/2022 CLINICAL DATA:  LEFT heel pain, fell off chair onto LEFT foot EXAM: LEFT OS CALCIS - 2+ VIEW COMPARISON:  None FINDINGS: Osseous demineralization. Joint spaces preserved. No acute fracture, dislocation, or bone destruction. IMPRESSION: No acute osseous abnormalities. Electronically Signed   By: Ulyses Southward M.D.   On: 08/31/2022 09:50   DG Foot Complete Left  Result Date: 08/31/2022 CLINICAL DATA:  LEFT heel pain for 4 days, fell off a chair onto LEFT foot EXAM: LEFT FOOT - COMPLETE 3+ VIEW COMPARISON:  None FINDINGS: Osseous demineralization. Bone island distal phalanx great toe. Joint spaces preserved. No acute fracture, dislocation, or bone destruction. IMPRESSION: No acute osseous abnormalities. Electronically Signed   By: Ulyses Southward M.D.   On: 08/31/2022 09:50     Procedures Procedures    Medications Ordered in ED Medications - No data to display  ED Course/ Medical Decision Making/ A&P Clinical Course as of 08/31/22 1751  Fri Aug 31, 2022  0927 X-rays of foot and heel interpreted by me as no acute fractures.  Awaiting radiology reading. [MB]    Clinical Course User Index [MB] Terrilee Files, MD                           Medical Decision Making Amount and/or Complexity of Data Reviewed Radiology: ordered.   Differential diagnosis includes contusion, fracture, dislocation, subluxation.  X-rays  ordered interpreted by me as no acute fracture or dislocation.  Reviewed with patient.  Symptomatic treatment discussed and outpatient follow-up with PCP.  Return instructions discussed        Final Clinical Impression(s) / ED Diagnoses Final diagnoses:  Contusion of left foot, initial encounter    Rx / DC Orders ED Discharge Orders     None         Terrilee Files, MD 08/31/22 1752

## 2022-08-31 NOTE — ED Triage Notes (Signed)
Pt from home c/o of pain to her left heel for the past the 4 days. Pt states that she fell off a chair onto her left foot. Pt denis loc and any other injuries.

## 2022-10-22 ENCOUNTER — Ambulatory Visit
Admission: RE | Admit: 2022-10-22 | Discharge: 2022-10-22 | Disposition: A | Discharge status: Home / Self Care | Payer: Medicare Other | Source: Ambulatory Visit | Attending: Acute Care | Admitting: Acute Care

## 2022-10-22 ENCOUNTER — Inpatient Hospital Stay: Admission: RE | Admit: 2022-10-22 | Payer: Medicare Other | Source: Ambulatory Visit

## 2022-10-22 DIAGNOSIS — Z122 Encounter for screening for malignant neoplasm of respiratory organs: Secondary | ICD-10-CM

## 2022-10-22 DIAGNOSIS — Z87891 Personal history of nicotine dependence: Secondary | ICD-10-CM

## 2022-10-22 DIAGNOSIS — F17210 Nicotine dependence, cigarettes, uncomplicated: Secondary | ICD-10-CM

## 2022-10-25 ENCOUNTER — Telehealth: Payer: Self-pay | Admitting: Acute Care

## 2022-10-25 NOTE — Telephone Encounter (Signed)
Left message for patient to call for LDCT results

## 2022-10-26 ENCOUNTER — Other Ambulatory Visit: Payer: Self-pay

## 2022-10-26 DIAGNOSIS — Z87891 Personal history of nicotine dependence: Secondary | ICD-10-CM

## 2022-10-26 DIAGNOSIS — F17210 Nicotine dependence, cigarettes, uncomplicated: Secondary | ICD-10-CM

## 2022-10-26 DIAGNOSIS — Z122 Encounter for screening for malignant neoplasm of respiratory organs: Secondary | ICD-10-CM

## 2023-04-25 ENCOUNTER — Other Ambulatory Visit (HOSPITAL_BASED_OUTPATIENT_CLINIC_OR_DEPARTMENT_OTHER): Payer: Self-pay | Admitting: Geriatric Medicine

## 2023-04-25 DIAGNOSIS — Z13820 Encounter for screening for osteoporosis: Secondary | ICD-10-CM

## 2023-10-24 ENCOUNTER — Ambulatory Visit
Admission: RE | Admit: 2023-10-24 | Discharge: 2023-10-24 | Disposition: A | Discharge status: Home / Self Care | Payer: Medicare Other | Source: Ambulatory Visit | Attending: Acute Care | Admitting: Acute Care

## 2023-10-24 DIAGNOSIS — Z122 Encounter for screening for malignant neoplasm of respiratory organs: Secondary | ICD-10-CM

## 2023-10-24 DIAGNOSIS — Z87891 Personal history of nicotine dependence: Secondary | ICD-10-CM

## 2023-10-24 DIAGNOSIS — F17210 Nicotine dependence, cigarettes, uncomplicated: Secondary | ICD-10-CM

## 2023-11-25 ENCOUNTER — Other Ambulatory Visit: Payer: Self-pay

## 2023-11-25 DIAGNOSIS — Z87891 Personal history of nicotine dependence: Secondary | ICD-10-CM

## 2023-11-25 DIAGNOSIS — Z122 Encounter for screening for malignant neoplasm of respiratory organs: Secondary | ICD-10-CM

## 2023-11-25 DIAGNOSIS — F17210 Nicotine dependence, cigarettes, uncomplicated: Secondary | ICD-10-CM
